# Patient Record
Sex: Female | Born: 1950 | Race: Black or African American | Hispanic: No | Marital: Married | Smoking: Never smoker
Health system: Southern US, Community
[De-identification: ages and names within clinical notes are randomized; demographics above are authoritative.]

## PROBLEM LIST (undated history)

## (undated) DIAGNOSIS — R2240 Localized swelling, mass and lump, unspecified lower limb: Secondary | ICD-10-CM

## (undated) HISTORY — PX: MASS EXCISION: SHX2000

## (undated) HISTORY — PX: EYE SURGERY: SHX253

## (undated) HISTORY — PX: CERVICAL CONIZATION W/BX: SHX1330

## (undated) HISTORY — DX: Localized swelling, mass and lump, unspecified lower limb: R22.40

---

## 2012-05-10 ENCOUNTER — Ambulatory Visit (INDEPENDENT_AMBULATORY_CARE_PROVIDER_SITE_OTHER): Payer: Self-pay | Admitting: General Surgery

## 2012-05-17 ENCOUNTER — Encounter (INDEPENDENT_AMBULATORY_CARE_PROVIDER_SITE_OTHER): Payer: Self-pay | Admitting: Surgery

## 2012-05-17 ENCOUNTER — Ambulatory Visit (INDEPENDENT_AMBULATORY_CARE_PROVIDER_SITE_OTHER): Payer: Self-pay | Admitting: Surgery

## 2012-05-17 VITALS — BP 118/80 | HR 73 | Temp 97.5°F | Resp 18 | Ht 60.0 in | Wt 152.4 lb

## 2012-05-17 DIAGNOSIS — R2240 Localized swelling, mass and lump, unspecified lower limb: Secondary | ICD-10-CM | POA: Insufficient documentation

## 2012-05-17 DIAGNOSIS — R2242 Localized swelling, mass and lump, left lower limb: Secondary | ICD-10-CM

## 2012-05-17 DIAGNOSIS — R229 Localized swelling, mass and lump, unspecified: Secondary | ICD-10-CM

## 2012-05-17 HISTORY — DX: Localized swelling, mass and lump, unspecified lower limb: R22.40

## 2012-05-17 NOTE — Progress Notes (Signed)
Subjective:     Patient ID: Kari Ewing, female   DOB: [birth year], 62 y.o.   MRN: [id number]  HPI  TYSON MASIN  1960-09-02 [id number]  Patient Care Team: Quitman Livings as PCP - General (Internal Medicine) Q[place]man Livings as Attending Physician (Internal Medicine)  This patient is a 62 y.o.female who presents today for surgical evaluation at the request of Dr. Roseanne Reno.   Reason for visit: Persistent wound on left thigh  Pleasant female originally from [place].  Relocated here in 2012.  Comes in today with her daughter who is able to translate and feels comfortable doing that.  Patient had pain itchy fold area on her left thigh for some time.  Began to become ulcerated and three years ago.  Occasionally uncomfortable.  Slowly larger.  Has been seen.  Dressing changes done on it.  No improvement.  No improvement with topical care.  Therefore, patient sent to me for evaluation.  There are no active problems to display for this patient.   History reviewed. No pertinent past medical history.  Past Surgical History  Procedure Laterality Date  . Eye surgery      20 Years Ago.    History   Social History  . Marital Status: Married    Spouse Name: N/A    Number of Children: N/A  . Years of Education: N/A   Occupational History  . Not on file.   Social History Main Topics  . Smoking status: Never Smoker   . Smokeless tobacco: Never Used  . Alcohol Use: No  . Drug Use: No  . Sexually Active: Not on file   Other Topics Concern  . Not on file   Social History Narrative   Originally from [place].  Relocated to [facility] 2012    History reviewed. No pertinent family history.  No current outpatient prescriptions on file.   No current facility-administered medications for this visit.     No Known Allergies  BP 118/80  Pulse 73  Temp(Src) 97.5 F (36.4 C) (Temporal)  Resp 18  Ht 5' (1.524 m)  Wt 152 lb 6.4 oz (69.128 kg)  BMI 29.76 kg/m2  No results  found.   Review of Systems  Constitutional: Negative for fever, chills, diaphoresis, appetite change and fatigue.  HENT: Negative for ear pain, sore throat, trouble swallowing, neck pain and ear discharge.   Eyes: Negative for photophobia, discharge and visual disturbance.  Respiratory: Negative for cough, choking, chest tightness and shortness of breath.   Cardiovascular: Negative for chest pain and palpitations.  Gastrointestinal: Negative for nausea, vomiting, abdominal pain, diarrhea, constipation, anal bleeding and rectal pain.  Endocrine: Negative for cold intolerance and heat intolerance.  Genitourinary: Negative for dysuria, frequency and difficulty urinating.  Musculoskeletal: Negative for myalgias and gait problem.  Skin: Positive for wound. Negative for color change, pallor and rash.  Allergic/Immunologic: Negative for environmental allergies, food allergies and immunocompromised state.  Neurological: Negative for dizziness, speech difficulty, weakness and numbness.  Hematological: Negative for adenopathy.  Psychiatric/Behavioral: Negative for confusion and agitation. The patient is not nervous/anxious.        Objective:   Physical Exam  Constitutional: She is oriented to person, place, and time. She appears well-developed and well-nourished. No distress.  HENT:  Head: Normocephalic.  Mouth/Throat: Oropharynx is clear and moist. No oropharyngeal exudate.  Eyes: Conjunctivae and EOM are normal. Pupils are equal, round, and reactive to light. No scleral icterus.  Neck: Normal range of motion. Neck supple. No tracheal deviation  present.  Cardiovascular: Normal rate, regular rhythm and intact distal pulses.   Pulmonary/Chest: Effort normal and breath sounds normal. No respiratory distress. She exhibits no tenderness.  Abdominal: Soft. She exhibits no distension and no mass. There is no tenderness. Hernia confirmed negative in the right inguinal area and confirmed negative in the  left inguinal area.  Genitourinary: No vaginal discharge found.  Musculoskeletal: Normal range of motion. She exhibits no tenderness.  Lymphadenopathy:    She has no cervical adenopathy.       Right: No inguinal adenopathy present.       Left: No inguinal adenopathy present.  Neurological: She is alert and oriented to person, place, and time. No cranial nerve deficit. She exhibits normal muscle tone. Coordination normal.  Skin: Skin is warm and dry. No rash noted. She is not diaphoretic. No erythema. No pallor.     Psychiatric: She has a normal mood and affect. Her behavior is normal. Judgment and thought content normal.       Assessment:     Ulcerated mass on left anterior thigh x3 years.  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  Too large and deep to try and do under local anesthesia.  Would like at least monitor deep sedation with possible/probable laryngeal mask airway.  The patient is interested in proceeding:  The pathophysiology of skin & subcutaneous masses was discussed.  Natural history risks without surgery were discussed.  I recommended surgery to remove the mass.  I explained the technique of removal with use of local anesthesia & possible need for more aggressive sedation/anesthesia for patient comfort.    Risks such as bleeding, infection, heart attack, death, and other risks were discussed.  I noted a good likelihood this will help address the problem.   Possibility that this will not correct all symptoms was explained. Possibility of regrowth/recurrence of the mass was discussed.  We will work to minimize complications. Questions were answered.  The patient & her daughter express understanding & wish to proceed with surgery.

## 2012-05-17 NOTE — Patient Instructions (Addendum)
See the Handout(s) we gave you.  Consider surgery to remove the persistent ulcerated mass on your left anterior thigh.  Please call our office at [phone] if you wish to schedule surgery or if you have further questions / concerns.   GENERAL SURGERY: POST OP INSTRUCTIONS  1. DIET: Follow a light bland diet the first 24 hours after arrival home, such as soup, liquids, crackers, etc.  Be sure to include lots of fluids daily.  Avoid fast food or heavy meals as your are more likely to get nauseated.   2. Take your usually prescribed home medications unless otherwise directed. 3. PAIN CONTROL: a. Pain is best controlled by a usual combination of three different methods TOGETHER: i. Ice/Heat ii. Over the counter pain medication iii. Prescription pain medication b. Most patients will experience some swelling and bruising around the incisions.  Ice packs or heating pads (30-60 minutes up to 6 times a day) will help. Use ice for the first few days to help decrease swelling and bruising, then switch to heat to help relax tight/sore spots and speed recovery.  Some people prefer to use ice alone, heat alone, alternating between ice & heat.  Experiment to what works for you.  Swelling and bruising can take several weeks to resolve.   c. It is helpful to take an over-the-counter pain medication regularly for the first few weeks.  Choose one of the following that works best for you: i. Naproxen (Aleve, etc)  Two 220mg  tabs twice a day ii. Ibuprofen (Advil, etc) Three 200mg  tabs four times a day (every meal & bedtime) iii. Acetaminophen (Tylenol, etc) 500-650mg  four times a day (every meal & bedtime) d. A  prescription for pain medication (such as oxycodone, hydrocodone, etc) should be given to you upon discharge.  Take your pain medication as prescribed.  i. If you are having problems/concerns with the prescription medicine (does not control pain, nausea, vomiting, rash, itching, etc), please call us [phone]  [phone] to see if we need to switch you to a different pain medicine that will work better for you and/or control your side effect better. ii. If you need a refill on your pain medication, please contact your pharmacy.  They will contact our office to request authorization. Prescriptions will not be filled after 5 pm or on week-ends. 4. Avoid getting constipated.  Between the surgery and the pain medications, it is common to experience some constipation.  Increasing fluid intake and taking a fiber supplement (such as Metamucil, Citrucel, FiberCon, MiraLax, etc) 1-2 times a day regularly will usually help prevent this problem from occurring.  A mild laxative (prune juice, Milk of Magnesia, MiraLax, etc) should be taken according to package directions if there are no bowel movements after 48 hours.   5. Wash / shower every day.  You may shower over the dressings as they are waterproof.  Continue to shower over incision(s) after the dressing is off. 6. Remove your waterproof bandages 5 days after surgery.  You may leave the incision open to air.  You may have skin tapes (Steri Strips) covering the incision(s).  Leave them on until one week, then remove.  You may replace a dressing/Band-Aid to cover the incision for comfort if you wish.      7. ACTIVITIES as tolerated:   a. You may resume regular (light) daily activities beginning the next day-such as daily self-care, walking, climbing stairs-gradually increasing activities as tolerated.  If you can walk 30 minutes without difficulty, it  is safe to try more intense activity such as jogging, treadmill, bicycling, low-impact aerobics, swimming, etc. b. Save the most intensive and strenuous activity for last such as sit-ups, heavy lifting, contact sports, etc  Refrain from any heavy lifting or straining until you are off narcotics for pain control.   c. DO NOT PUSH THROUGH PAIN.  Let pain be your guide: If it hurts to do something, don't do it.  Pain is your  body warning you to avoid that activity for another week until the pain goes down. d. You may drive when you are no longer taking prescription pain medication, you can comfortably wear a seatbelt, and you can safely maneuver your car and apply brakes. e. Bonita Quin may have sexual intercourse when it is comfortable.  8. FOLLOW UP in our office a. Please call [facility] at [phone] to set up an appointment to see your surgeon in the office for a follow-up appointment approximately 2-3 weeks after your surgery. b. Make sure that you call for this appointment the day you arrive home to insure a convenient appointment time. 9. IF YOU HAVE DISABILITY OR FAMILY LEAVE FORMS, BRING THEM TO THE OFFICE FOR PROCESSING.  DO NOT GIVE THEM TO YOUR DOCTOR.   WHEN TO CALL us [phone]: 1. Poor pain control 2. Reactions / problems with new medications (rash/itching, nausea, etc)  3. Fever over 101.5 F (38.5 C) 4. Worsening swelling or bruising 5. Continued bleeding from incision. 6. Increased pain, redness, or drainage from the incision 7. Difficulty breathing / swallowing   The clinic staff is available to answer your questions during regular business hours (8:30am-5pm).  Please don't hesitate to call and ask to speak to one of our nurses for clinical concerns.   If you have a medical emergency, go to the nearest emergency room or call 911.  A surgeon from [facility] is always on call at the [facility], [place] [address], [place], [place]  [address] ? MAIN: [phone] ? TOLL FREE: [phone] ?  FAX [phone] [email or web address]

## 2012-06-06 ENCOUNTER — Other Ambulatory Visit (INDEPENDENT_AMBULATORY_CARE_PROVIDER_SITE_OTHER): Payer: Self-pay | Admitting: Surgery

## 2012-06-06 DIAGNOSIS — D367 Benign neoplasm of other specified sites: Secondary | ICD-10-CM

## 2012-06-10 ENCOUNTER — Telehealth (INDEPENDENT_AMBULATORY_CARE_PROVIDER_SITE_OTHER): Payer: Self-pay | Admitting: General Surgery

## 2012-06-10 NOTE — Telephone Encounter (Signed)
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  Excision = cure.  Tell pt the good news! ------

## 2012-06-10 NOTE — Telephone Encounter (Signed)
Patient made aware of path results. Will follow up at appt and call with any questions prior.  

## 2012-06-14 ENCOUNTER — Ambulatory Visit: Payer: No Typology Code available for payment source | Attending: Family Medicine | Admitting: Family Medicine

## 2012-06-14 VITALS — BP 116/74 | HR 70 | Temp 97.9°F | Resp 16 | Wt 154.8 lb

## 2012-06-14 DIAGNOSIS — M7989 Other specified soft tissue disorders: Secondary | ICD-10-CM | POA: Insufficient documentation

## 2012-06-14 DIAGNOSIS — B353 Tinea pedis: Secondary | ICD-10-CM

## 2012-06-14 MED ORDER — CLOTRIMAZOLE 1 % EX CREA: TOPICAL_CREAM | CUTANEOUS | Status: AC

## 2012-06-14 NOTE — Patient Instructions (Signed)
Athlete's Foot Athlete's foot (tinea pedis) is a fungal infection of the skin on the feet. It often occurs on the skin between the toes or underneath the toes. It can also occur on the soles of the feet. Athlete's foot is more likely to occur in hot, humid weather. Not washing your feet or changing your socks often enough can contribute to athlete's foot. The infection can spread from person to person (contagious). CAUSES Athlete's foot is caused by a fungus. This fungus thrives in warm, moist places. Most people get athlete's foot by sharing shower stalls, towels, and wet floors with an infected person. People with weakened immune systems, including those with diabetes, may be more likely to get athlete's foot. SYMPTOMS   Itchy areas between the toes or on the soles of the feet.  White, flaky, or scaly areas between the toes or on the soles of the feet.  Tiny, intensely itchy blisters between the toes or on the soles of the feet.  Tiny cuts on the skin. These cuts can develop a bacterial infection.  Thick or discolored toenails. DIAGNOSIS  Your caregiver can usually tell what the problem is by doing a physical exam. Your caregiver may also take a skin sample from the rash area. The skin sample may be examined under a microscope, or it may be tested to see if fungus will grow in the sample. A sample may also be taken from your toenail for testing. TREATMENT  Over-the-counter and prescription medicines can be used to kill the fungus. These medicines are available as powders or creams. Your caregiver can suggest medicines for you. Fungal infections respond slowly to treatment. You may need to continue using your medicine for several weeks. PREVENTION   Do not share towels.  Wear sandals in wet areas, such as shared locker rooms and shared showers.  Keep your feet dry. Wear shoes that allow air to circulate. Wear cotton or wool socks. HOME CARE INSTRUCTIONS   Take medicines as directed by  your caregiver. Do not use steroid creams on athlete's foot.  Keep your feet clean and cool. Wash your feet daily and dry them thoroughly, especially between your toes.  Change your socks every day. Wear cotton or wool socks. In hot climates, you may need to change your socks 2 to 3 times per day.  Wear sandals or canvas tennis shoes with good air circulation.  If you have blisters, soak your feet in Burow's solution or Epsom salts for 20 to 30 minutes, 2 times a day to dry out the blisters. Make sure you dry your feet thoroughly afterward. SEEK MEDICAL CARE IF:   You have a fever.  You have swelling, soreness, warmth, or redness in your foot.  You are not getting better after 7 days of treatment.  You are not completely cured after 30 days.  You have any problems caused by your medicines. MAKE SURE YOU:   Understand these instructions.  Will watch your condition.  Will get help right away if you are not doing well or get worse. Document Released: 12/20/1999 Document Revised: 03/16/2011 Document Reviewed: 10/10/2010 ExitCare Patient Information 2014 [employer].  

## 2012-06-14 NOTE — Progress Notes (Signed)
Subjective:     Patient ID: Kari Ewing, female   DOB: [birth year], 62 y.o.   MRN: [id number]  HPIPt here to establish care - walk in.  She has had itchy feet for 1 year. She has tried lotion but it has not helped. Occasional flaking/scaling. Denies burning. Occasional swelling if she has been standing/sitting all day long.    Review of Systems per hpi     Objective:   Physical Exam b/l feet neurovasc intact, slight erythema and dryness.      Assessment:     Tinea pedis - Plan: clotrimazole (LOTRIMIN) 1 % cream       Plan:     Suspect athletes foot - will treat with clotimazole and see how she does. If not improving at all next week would do KOH scraping, consider derm referral, and check neuropathy labs.   Will have her rtc for f/u next week to readdress this and to address her other concerns of otalgia and watery eye. Call or rtc prn before that time if needed.

## 2012-06-14 NOTE — Progress Notes (Signed)
Patient presents today with bilateral foot pain for about a year Also complains of pain in both ears

## 2012-06-20 ENCOUNTER — Ambulatory Visit (INDEPENDENT_AMBULATORY_CARE_PROVIDER_SITE_OTHER): Payer: PRIVATE HEALTH INSURANCE | Admitting: Surgery

## 2012-06-20 ENCOUNTER — Encounter (INDEPENDENT_AMBULATORY_CARE_PROVIDER_SITE_OTHER): Payer: Self-pay | Admitting: Surgery

## 2012-06-20 VITALS — BP 132/78 | HR 78 | Temp 97.8°F | Resp 14 | Ht 58.5 in | Wt 156.4 lb

## 2012-06-20 DIAGNOSIS — R229 Localized swelling, mass and lump, unspecified: Secondary | ICD-10-CM

## 2012-06-20 DIAGNOSIS — R2242 Localized swelling, mass and lump, left lower limb: Secondary | ICD-10-CM

## 2012-06-20 NOTE — Progress Notes (Signed)
Subjective:     Patient ID: Kari Ewing, female   DOB: 1950-12-22, 62 y.o.   MRN: [id number]  HPI  Kari Ewing  [birth year] [id number]  Patient Care Team: Kari Fleet, MD as PCP - General (Family Medicine)  This patient is a 62 y.o.female who presents today for surgical evaluation s/p removal of thigh mass 06/06/2012  The patient comes in with her daughter.  Pain down.  A little bit of drainage at the incision concerns her.  No fevers or chills.  Walking well.  They receive results from pathology & received it was benign.  FINAL DIAGNOSIS Diagnosis Soft tissue mass, simple excision, left anterior thigh - NODULAR HYDRADENOMA. - SEE COMMENT. Microscopic Comment Drs. Phadke and Yaar of dermatopathology have reviewed the case and concur with the interpretation. (JBK:caf 06/10/12) [name] Pathologist, Electronic Signature (Case signed 06/10/2012) Specimen Kari Ewing and Clinical Information Specimen Comment Subcutaneous mass, 3 yr ulcer Specimen(s) Obtained: Soft tissue mass, simple excision, left anterior thigh Specimen Clinical Information R/O Marjolin ulcer Kari Ewing Received in formalin is a 3.8 x 3.4 x 3.3 cm indurated fatty tissue with an overlying 4 x 2.2 cm tan-brown skin ellipse. The central skin has a 2.1 x 2 x 0.3 cm roughened nodular area. The specimen is inked, and on sectioning has an underlying 3.4 x 2.4 x 2 cm well-defined nodular mass with tan-white firm to focally friable cut surfaces, and a focal 1.4 cm in diameter cystic area containing clear fluid. Representative sections are submitted as follows: A = section of cystic component. B, C = superficial halves including overlying nodular skin. D, E = deeper half sections. F = 2 radial sections of ends. Total of 6 blocks. (SSW:ecj 06/07/2012) 1 of 2 FINAL for Kari Ewing, [name] Report signed out from the following location(s) [facility] [address],[place]  [address].CLIA:[id number]., [facility] [address] [place], [place], [place] [address]. CLIA #: [id number], 2 of    Patient Active Problem List   Diagnosis Date Noted  . 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 Eye surgery      20 Years Ago.  . Mass excision Left [id number]    History   Social History  . Marital Status: Married    Spouse Name: N/A    Number of Children: N/A  . Years of Education: N/A   Occupational History  . Not on file.   Social History Main Topics  . Smoking status: Never Smoker   . Smokeless tobacco: Never Used  . Alcohol Use: No  . Drug Use: No  . Sexually Active: Not on file   Other Topics Concern  . Not on file   Social History Narrative   Originally from [place].  Relocated to [facility] 2012    History reviewed. No pertinent family history.  Current Outpatient Prescriptions  Medication Sig Dispense Refill  . clotrimazole (LOTRIMIN) 1 % cream Apply twice daily for 4 weeks  60 g  0   No current facility-administered medications for this visit.     No Known Allergies  BP 132/78  Pulse 78  Temp(Src) 97.8 F (36.6 C) (Temporal)  Resp 14  Ht 4' 10.5" (1.486 m)  Wt 156 lb 6.4 oz (70.943 kg)  BMI 32.13 kg/m2  No results  found.   Review of Systems  Constitutional: Negative for fever, chills and diaphoresis.  HENT: Negative for ear pain, sore throat and trouble swallowing.   Eyes: Negative for photophobia and visual disturbance.  Respiratory: Negative for cough and choking.   Cardiovascular: Negative for chest pain and palpitations.  Gastrointestinal: Negative for nausea, vomiting, abdominal pain, diarrhea, constipation, anal bleeding and rectal pain.  Genitourinary: Negative for dysuria, frequency and  difficulty urinating.  Musculoskeletal: Negative for myalgias and gait problem.  Skin: Positive for wound. Negative for color change, pallor and rash.  Neurological: Negative for dizziness, speech difficulty, weakness and numbness.  Hematological: Negative for adenopathy.  Psychiatric/Behavioral: Negative for confusion and agitation. The patient is not nervous/anxious.        Objective:   Physical Exam  Constitutional: She is oriented to person, place, and time. She appears well-developed and well-nourished. No distress.  HENT:  Head: Normocephalic.  Mouth/Throat: Oropharynx is clear and moist. No oropharyngeal exudate.  Eyes: Conjunctivae and EOM are normal. Pupils are equal, round, and reactive to light. No scleral icterus.  Neck: Normal range of motion. No tracheal deviation present.  Cardiovascular: Normal rate and intact distal pulses.   Pulmonary/Chest: Effort normal. No respiratory distress. She exhibits no tenderness.  Abdominal: Soft. She exhibits no distension. There is no tenderness. Hernia confirmed negative in the right inguinal area and confirmed negative in the left inguinal area.  Incisions clean with normal healing ridges.  No hernias  Genitourinary: No vaginal discharge found.  Musculoskeletal: Normal range of motion. She exhibits no tenderness.       Legs: Lymphadenopathy:       Right: No inguinal adenopathy present.       Left: No inguinal adenopathy present.  Neurological: She is alert and oriented to person, place, and time. No cranial nerve deficit. She exhibits normal muscle tone. Coordination normal.  Skin: Skin is warm and dry. No rash noted. She is not diaphoretic.  Psychiatric: Her behavior is normal. Her mood appears anxious.  Consolable       Assessment:     Two weeks status for removal of benign cystic mass on left anterior thigh (nodular hidradenoma) small separation of skin but no evidence of abscess     Plan:     They are relieved it was  benign.  I have recommended covering the small area of skin separation with a Band-Aid.  Wash the wound with soap and water every day.  It should scab over and close.  If worsens, come back sooner.  Otherwise see [place] every two weeks until this closes down.  Increase activity as tolerated to regular activity.  Low impact exercise such as walking an hour a day at least ideal.  Do not push through pain.  Diet as tolerated.  Low fat high fiber diet ideal.  Bowel regimen with 30 g fiber a day and fiber supplement as needed to avoid problems.   Instructions discussed.  Followup with primary care physician for other health issues as would normally be done.  Questions answered.  The patient expressed understanding and appreciation

## 2012-06-20 NOTE — Patient Instructions (Addendum)
WOUND CARE  It is important that the wound be kept open.   -Keeping the skin edges apart will allow the wound to gradually heal from the base upwards.   - If the skin edges of the wound close too early, a new fluid pocket can form and infection can occur. -This is the reason to pack deeper wounds with gauze or ribbon -This is why drained wounds cannot be sewed closed right away  A healthy wound should form a lining of bright red "beefy" granulating tissue that will help shrink the wound and help the edges grow new skin into it.   -A little mucus / yellow discharge is normal (the body's natural way to try and form a scab) and should be gently washed off with soap and water with daily dressing changes.  -Green or foul smelling drainage implies bacterial colonization and can slow wound healing - a short course of antibiotic ointment (3-5 days) can help it clear up.  Call the doctor if it does not improve or worsens  -Avoid use of antibiotic ointments for more than a week as they can slow wound healing over time.    -Sometimes other wound care products will be used to reduce need for dressing changes and/or help clean up dirty wounds -Sometimes the surgeon needs to debride the wound in the office to remove dead or infected tissue out of the wound so it can heal more quickly and safely.    Change the dressing at least once a day -Wash the wound with mild soap and water gently every day.  It is good to shower or bathe the wound to help it clean out. -Use clean 4x4 gauze or large Band Aid (it does not need to be sterile, just clean) -Keep the skin dry around the wound to prevent breakdown and irritation.  Complete all antibiotics through the entire prescription to help the infection heal and prevent new places of infection   Returning the see the surgeon is helpful to follow the healing process and help the wound close as fast as possible.

## 2012-06-22 ENCOUNTER — Ambulatory Visit: Payer: No Typology Code available for payment source | Attending: Family Medicine | Admitting: Internal Medicine

## 2012-06-22 VITALS — BP 114/77 | HR 76 | Temp 98.0°F | Resp 17 | Ht 60.0 in | Wt 154.2 lb

## 2012-06-22 DIAGNOSIS — H9201 Otalgia, right ear: Secondary | ICD-10-CM

## 2012-06-22 DIAGNOSIS — H9209 Otalgia, unspecified ear: Secondary | ICD-10-CM

## 2012-06-22 NOTE — Progress Notes (Signed)
Patient here for right ear discomfort Says suffers from wax build up

## 2012-06-22 NOTE — Progress Notes (Signed)
Patient ID: Kari Ewing, female   DOB: 1950/01/29, 62 y.o.   MRN: [id number]  CC: Right ear pain  HPI: 62 year old female with no significant past medical history who presents to our clinic for followup and complains of hearing loss in right ear as well as pain in the right ear. Patient's daughter reported she initially thought that this was cerumen but in the right ear however even with some wax removal the patient still continues to have pain as well as hearing loss.  No Known Allergies Past Medical History  Diagnosis Date  . 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 clotrimazole (LOTRIMIN) 1 % cream Apply twice daily for 4 weeks  60 g  0   No current facility-administered medications on file prior to visit.   Family medical history significant for HTN, HLD  History   Social History  . Marital Status: Married    Spouse Name: N/A    Number of Children: N/A  . Years of Education: N/A   Occupational History  . Not on file.   Social History Main Topics  . Smoking status: Never Smoker   . Smokeless tobacco: Never Used  . Alcohol Use: No  . Drug Use: No  . Sexually Active: Not on file   Other Topics Concern  . Not on file   Social History Narrative   Originally from [place].  Relocated to [facility] 2012    Review of Systems  Constitutional: Negative for fever, chills, diaphoresis, activity change, appetite change and fatigue.  HENT: Negative for ear pain, nosebleeds, congestion, facial swelling, rhinorrhea, neck pain, neck stiffness and ear discharge.   Eyes: Negative for pain, discharge, redness, itching and visual disturbance.  Respiratory: Negative for cough, choking, chest tightness, shortness of breath, wheezing and stridor.   Cardiovascular: Negative for chest pain, palpitations and leg swelling.  Gastrointestinal: Negative for  abdominal distention.  Genitourinary: Negative for dysuria, urgency, frequency, hematuria, flank pain, decreased urine volume, difficulty urinating and dyspareunia.  Musculoskeletal: Negative for back pain, joint swelling, arthralgias and gait problem.  Neurological: Negative for dizziness, tremors, seizures, syncope, facial asymmetry, speech difficulty, weakness, light-headedness, numbness and headaches.  Hematological: Negative for adenopathy. Does not bruise/bleed easily.  Psychiatric/Behavioral: Negative for hallucinations, behavioral problems, confusion, dysphoric mood, decreased concentration and agitation.    Objective:   Filed Vitals:   06/22/12 1241  BP: 114/77  Pulse: 76  Temp: 98 F (36.7 C)  Resp: 17    Physical Exam  Constitutional: Appears well-developed and well-nourished. No distress.  HENT: Normocephalic. Tympanic membrane visualized in left year. Right ear unable to visualize tympanic membrane, whitish material noticed in the area of tympanic membrane Eyes: Conjunctivae and EOM are normal. PERRLA, no scleral icterus.  Neck: Normal ROM. Neck supple. No JVD. No tracheal deviation. No thyromegaly.  CVS: RRR, S1/S2 +, no murmurs, no gallops, no carotid bruit.  Pulmonary: Effort and breath sounds normal, no stridor, rhonchi, wheezes, rales.  Abdominal: Soft. BS +,  no distension, tenderness, rebound or guarding.  Musculoskeletal: Normal range of motion. No edema and no tenderness.  Lymphadenopathy: No lymphadenopathy noted, cervical, inguinal. Neuro: Alert. Normal reflexes, muscle tone coordination. No cranial nerve deficit. Skin: Skin is warm and dry. No rash noted. Not diaphoretic. No erythema. No pallor.  Psychiatric: Normal mood and affect. Behavior, judgment, thought content normal.   No results found for this basename: WBC, HGB,  HCT, MCV, PLT   No results found for this basename: CREATININE, BUN, NA, K, CL, CO2    No results found for this basename: HGBA1C    Lipid Panel  No results found for this basename: chol, trig, hdl, cholhdl, vldl, ldlcalc       Assessment and plan:   Patient Active Problem List   Diagnosis Date Noted  . Ear pain, right  06/22/2012    Priority: High - In addition to slight hearing loss on the right ear  - Will provide referral to ENT. On physical exam there are is no obvious impacted cerumen to explain the etiology of pain and hearing loss.

## 2012-06-22 NOTE — Patient Instructions (Signed)

## 2012-07-06 ENCOUNTER — Encounter (INDEPENDENT_AMBULATORY_CARE_PROVIDER_SITE_OTHER): Payer: Self-pay | Admitting: Surgery

## 2012-07-06 ENCOUNTER — Ambulatory Visit (INDEPENDENT_AMBULATORY_CARE_PROVIDER_SITE_OTHER): Payer: PRIVATE HEALTH INSURANCE | Admitting: Surgery

## 2012-07-06 VITALS — BP 124/78 | HR 74 | Temp 97.2°F | Resp 14 | Ht 58.5 in | Wt 155.6 lb

## 2012-07-06 DIAGNOSIS — R229 Localized swelling, mass and lump, unspecified: Secondary | ICD-10-CM

## 2012-07-06 DIAGNOSIS — R2242 Localized swelling, mass and lump, left lower limb: Secondary | ICD-10-CM

## 2012-07-06 NOTE — Progress Notes (Signed)
Subjective:     Patient ID: Kari Ewing, female   DOB: [birth year], 62 y.o.   MRN: [id number]  HPI   [name]  22-Mar-1950 [id number]  Patient Care Team: Cleora Fleet, MD as PCP - General (Family Medicine)  This patient is a 62 y.o.female who presents today for surgical evaluation s/p removal of thigh mass 06/06/2012  The patient comes in with her daughter.  Pain minoe except when walking.  Drainage is gone.  Just covering up with a Band-Aid that needs to be changed once a day.  She tolerates walking but is afraid she may hurt things if she does not too much.  No fevers or chills.  Walking well.  They receive results from pathology & received it was benign.  FINAL DIAGNOSIS Diagnosis Soft tissue mass, simple excision, left anterior thigh - NODULAR HYDRADENOMA. - SEE COMMENT. Microscopic Comment Drs. Phadke and Yaar of dermatopathology have reviewed the case and concur with the interpretation. (JBK:caf 06/10/12) [name] Pathologist, Electronic Signature (Case signed 06/10/2012) Specimen [name] and Clinical Information Specimen Comment Subcutaneous mass, 3 yr ulcer Specimen(s) Obtained: Soft tissue mass, simple excision, left anterior thigh Specimen Clinical Information R/O Marjolin ulcer [name] Received in formalin is a 3.8 x 3.4 x 3.3 cm indurated fatty tissue with an overlying 4 x 2.2 cm tan-brown skin ellipse. The central skin has a 2.1 x 2 x 0.3 cm roughened nodular area. The specimen is inked, and on sectioning has an underlying 3.4 x 2.4 x 2 cm well-defined nodular mass with tan-white firm to focally friable cut surfaces, and a focal 1.4 cm in diameter cystic area containing clear fluid. Representative sections are submitted as follows: A = section of cystic component. B, C = superficial halves including overlying nodular skin. D, E = deeper half sections. F = 2 radial sections of ends. Total of 6 blocks. (SSW:ecj 06/07/2012) 1 of 2 FINAL for Brandis,  Arminda A [phone]) Report signed out from the following location(s) [facility] [address],[place] [address].CLIA:[id number]., [facility] [address] [place], [place], [place] [address]. CLIA #: [id number], 2 of    Patient Active Problem List   Diagnosis Date Noted  . Ear pain 06/22/2012  . 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 Eye surgery      20 Years Ago.  . Mass excision Left [id number]    History   Social History  . Marital Status: Married    Spouse Name: N/A    Number of Children: N/A  . Years of Education: N/A   Occupational History  . Not on file.   Social History Main Topics  . Smoking status: Never Smoker   . Smokeless tobacco: Never Used  . Alcohol Use: No  . Drug Use: No  . Sexually Active: Not on file   Other Topics Concern  . Not on file   Social History Narrative   Originally from [place].  Relocated to [facility] 2012    No family history on file.  Current Outpatient Prescriptions  Medication Sig Dispense Refill  . acetaminophen (TYLENOL) 325 MG tablet Take 650 mg by mouth every 6 (six) hours as needed for pain.      . clotrimazole (LOTRIMIN) 1 % cream Apply twice daily for 4 weeks  60 g  0   No current facility-administered medications for this visit.     No Known Allergies  BP 124/78  Pulse 74  Temp(Src) 97.2 F (36.2 C)  Resp 14  Ht 4' 10.5" (1.486 m)  Wt 155 lb 9.6 oz (70.58 kg)  BMI 31.96 kg/m2  No results found.   Review of Systems  Constitutional: Negative for fever, chills and diaphoresis.  HENT: Negative for ear pain, sore throat and trouble swallowing.   Eyes: Negative for photophobia and visual disturbance.  Respiratory: Negative for cough and  choking.   Cardiovascular: Negative for chest pain and palpitations.  Gastrointestinal: Negative for nausea, vomiting, abdominal pain, diarrhea, constipation, anal bleeding and rectal pain.  Genitourinary: Negative for dysuria, frequency and difficulty urinating.  Musculoskeletal: Negative for myalgias and gait problem.  Skin: Positive for wound. Negative for color change, pallor and rash.  Neurological: Negative for dizziness, speech difficulty, weakness and numbness.  Hematological: Negative for adenopathy.  Psychiatric/Behavioral: Negative for confusion and agitation. The patient is not nervous/anxious.        Objective:   Physical Exam  Constitutional: She is oriented to person, place, and time. She appears well-developed and well-nourished. No distress.  HENT:  Head: Normocephalic.  Mouth/Throat: Oropharynx is clear and moist. No oropharyngeal exudate.  Eyes: Conjunctivae and EOM are normal. Pupils are equal, round, and reactive to light. No scleral icterus.  Neck: Normal range of motion. No tracheal deviation present.  Cardiovascular: Normal rate and intact distal pulses.   Pulmonary/Chest: Effort normal. No respiratory distress. She exhibits no tenderness.  Abdominal: Soft. She exhibits no distension. There is no tenderness. Hernia confirmed negative in the right inguinal area and confirmed negative in the left inguinal area.  Incisions clean with normal healing ridges.  No hernias  Genitourinary: No vaginal discharge found.  Musculoskeletal: Normal range of motion. She exhibits no tenderness.       Legs: Lymphadenopathy:       Right: No inguinal adenopathy present.       Left: No inguinal adenopathy present.  Neurological: She is alert and oriented to person, place, and time. No cranial nerve deficit. She exhibits normal muscle tone. Coordination normal.  Skin: Skin is warm and dry. No rash noted. She is not diaphoretic.  Psychiatric: Her behavior is normal. Her mood appears  anxious.  Consolable       Assessment:     One month status for removal of benign cystic mass on left anterior thigh (nodular hidradenoma) small separation of skin but no evidence of abscess     Plan:     They are relieved it was benign.  I have recommended covering the small area of skin separation with a Band-Aid.  Wash the wound with soap and water every day.  It should scab over and close.  If worsens, come back sooner.  Otherwise see [place] every 6 weeks until this closes down.  Vicryl stitches should fall out on own - avoid removing unless not healing  Increase activity as tolerated to regular activity.  Low impact exercise such as walking an hour a day at least ideal.  Do not push through pain.    Instructions discussed.  Followup with primary care physician for other health issues as would normally be done.  Questions answered.  The patient expressed understanding and appreciation

## 2012-07-06 NOTE — Patient Instructions (Addendum)
Continue wound care with soap and water over wound.  You can leave open to air to let it dry out or continue to cover with a Band-Aid.

## 2012-08-17 ENCOUNTER — Ambulatory Visit (INDEPENDENT_AMBULATORY_CARE_PROVIDER_SITE_OTHER): Payer: PRIVATE HEALTH INSURANCE | Admitting: Surgery

## 2012-08-17 ENCOUNTER — Encounter (INDEPENDENT_AMBULATORY_CARE_PROVIDER_SITE_OTHER): Payer: Self-pay | Admitting: Surgery

## 2012-08-17 VITALS — BP 102/66 | HR 72 | Resp 16 | Ht 59.0 in | Wt 154.0 lb

## 2012-08-17 DIAGNOSIS — R2242 Localized swelling, mass and lump, left lower limb: Secondary | ICD-10-CM

## 2012-08-17 DIAGNOSIS — R229 Localized swelling, mass and lump, unspecified: Secondary | ICD-10-CM

## 2012-08-17 NOTE — Patient Instructions (Addendum)
GENERAL SURGERY: POST OP INSTRUCTIONS  1. DIET: Follow a light bland diet the first 24 hours after arrival home, such as soup, liquids, crackers, etc.  Be sure to include lots of fluids daily.  Avoid fast food or heavy meals as your are more likely to get nauseated.   2. Take your usually prescribed home medications unless otherwise directed. 3. PAIN CONTROL: a. Pain is best controlled by a usual combination of three different methods TOGETHER: i. Ice/Heat ii. Over the counter pain medication iii. Prescription pain medication b. Most patients will experience some swelling and bruising around the incisions.  Ice packs or heating pads (30-60 minutes up to 6 times a day) will help. Use ice for the first few days to help decrease swelling and bruising, then switch to heat to help relax tight/sore spots and speed recovery.  Some people prefer to use ice alone, heat alone, alternating between ice & heat.  Experiment to what works for you.  Swelling and bruising can take several weeks to resolve.   c. It is helpful to take an over-the-counter pain medication regularly for the first few weeks.  Choose one of the following that works best for you: i. Naproxen (Aleve, etc)  Two 274m tabs twice a day ii. Ibuprofen (Advil, etc) Three 2085mtabs four times a day (every meal & bedtime) iii. Acetaminophen (Tylenol, etc) 500-65021mour times a day (every meal & bedtime) d. A  prescription for pain medication (such as oxycodone, hydrocodone, etc) should be given to you upon discharge.  Take your pain medication as prescribed.  i. If you are having problems/concerns with the prescription medicine (does not control pain, nausea, vomiting, rash, itching, etc), please call us [phone] see if we need to switch you to a different pain medicine that will work better for you and/or control your side effect better. ii. If you need a refill on your pain medication, please contact your pharmacy.  They will contact our  office to request authorization. Prescriptions will not be filled after 5 pm or on week-ends. 4. Avoid getting constipated.  Between the surgery and the pain medications, it is common to experience some constipation.  Increasing fluid intake and taking a fiber supplement (such as Metamucil, Citrucel, FiberCon, MiraLax, etc) 1-2 times a day regularly will usually help prevent this problem from occurring.  A mild laxative (prune juice, Milk of Magnesia, MiraLax, etc) should be taken according to package directions if there are no bowel movements after 48 hours.   5. Wash / shower every day.  You may shower over the dressings as they are waterproof.  Continue to shower over incision(s) after the dressing is off. 6. Remove your waterproof bandages 5 days after surgery.  You may leave the incision open to air.  You may have skin tapes (Steri Strips) covering the incision(s).  Leave them on until one week, then remove.  You may replace a dressing/Band-Aid to cover the incision for comfort if you wish.      7. ACTIVITIES as tolerated:   a. You may resume regular (light) daily activities beginning the next day-such as daily self-care, walking, climbing stairs-gradually increasing activities as tolerated.  If you can walk 30 minutes without difficulty, it is safe to try more intense activity such as jogging, treadmill, bicycling, low-impact aerobics, swimming, etc. b. Save the most intensive and strenuous activity for last such as sit-ups, heavy lifting, contact sports, etc  Refrain from any heavy lifting or straining until you  are off narcotics for pain control.   c. DO NOT PUSH THROUGH PAIN.  Let pain be your guide: If it hurts to do something, don't do it.  Pain is your body warning you to avoid that activity for another week until the pain goes down. d. You may drive when you are no longer taking prescription pain medication, you can comfortably wear a seatbelt, and you can safely maneuver your car and apply  brakes. e. Bonita Quin may have sexual intercourse when it is comfortable.  8. FOLLOW UP in our office a. Please call [facility] at [phone] to set up an appointment to see your surgeon in the office for a follow-up appointment approximately 2-3 weeks after your surgery. b. Make sure that you call for this appointment the day you arrive home to insure a convenient appointment time. 9. IF YOU HAVE DISABILITY OR FAMILY LEAVE FORMS, BRING THEM TO THE OFFICE FOR PROCESSING.  DO NOT GIVE THEM TO YOUR DOCTOR.   WHEN TO CALL us [phone]: 1. Poor pain control 2. Reactions / problems with new medications (rash/itching, nausea, etc)  3. Fever over 101.5 F (38.5 C) 4. Worsening swelling or bruising 5. Continued bleeding from incision. 6. Increased pain, redness, or drainage from the incision 7. Difficulty breathing / swallowing   The clinic staff is available to answer your questions during regular business hours (8:30am-5pm).  Please don't hesitate to call and ask to speak to one of our nurses for clinical concerns.   If you have a medical emergency, go to the nearest emergency room or call 911.  A surgeon from [facility] is always on call at the [facility], [place] [address], [place], [place]  [address] ? MAIN: [phone] ? TOLL FREE: [phone] ?  FAX [phone] [email or web address]  Exercise to Lose Weight Exercise and a healthy diet may help you lose weight. Your doctor may suggest specific exercises. EXERCISE IDEAS AND TIPS  Choose low-cost things you enjoy doing, such as walking, bicycling, or exercising to workout videos.  Take stairs instead of the elevator.  Walk during your lunch break.  Park your car further away from work or school.  Go to a gym or an exercise class.  Start with 5 to 10 minutes of exercise each day. Build up to 30 minutes of exercise 4 to 6 days a week.  Wear shoes with good  support and comfortable clothes.  Stretch before and after working out.  Work out until you breathe harder and your heart beats faster.  Drink extra water when you exercise.  Do not do so much that you hurt yourself, feel dizzy, or get very short of breath. Exercises that burn about 150 calories:  Running 1  miles in 15 minutes.  Playing volleyball for 45 to 60 minutes.  Washing and waxing a car for 45 to 60 minutes.  Playing touch football for 45 minutes.  Walking 1  miles in 35 minutes.  Pushing a stroller 1  miles in 30 minutes.  Playing basketball for 30 minutes.  Raking leaves for 30 minutes.  Bicycling 5 miles in 30 minutes.  Walking 2 miles in 30 minutes.  Dancing for 30 minutes.  Shoveling snow for 15 minutes.  Swimming laps for 20 minutes.  Walking up stairs for 15 minutes.  Bicycling 4 miles in 15 minutes.  Gardening for 30 to 45 minutes.  Jumping rope for 15 minutes.  Washing windows or  floors for 45 to 60 minutes. Document Released: 01/24/2010 Document Revised: 03/16/2011 Document Reviewed: 01/24/2010 [facility] Patient Information 2014 [place], [place].

## 2012-08-17 NOTE — Progress Notes (Signed)
Subjective:     Patient ID: Kari Ewing, female   DOB: 12-25-1950, 62 y.o.   MRN: [id number]  HPI   Kari Ewing  Jul 01, 1950 [id number]  Patient Care Team: Kari Fleet, MD as PCP - General (Family Medicine)  This patient is Ewing 62 y.o.female who presents today for surgical evaluation s/p removal of thigh mass 06/06/2012  The patient comes in with her daughter. She is feeling better.  No more drainage.  Soreness when she walks after Ewing while so she stops.  Not using any pain medicines.  No fevers or chills.  She is very appreciative of how things are going.  FINAL DIAGNOSIS Diagnosis Soft tissue mass, simple excision, left anterior thigh - NODULAR HYDRADENOMA. - SEE COMMENT. Microscopic Comment Drs. Kari Ewing and Kari Ewing of dermatopathology have reviewed the case and concur with the interpretation. (JBK:caf 06/10/12) Kari Leisure MD Pathologist, Electronic Signature (Case signed 06/10/2012) Specimen Kari Ewing and Clinical Information Specimen Comment Subcutaneous mass, 3 yr ulcer Specimen(s) Obtained: Soft tissue mass, simple excision, left anterior thigh Specimen Clinical Information R/O Kari Ewing ulcer Kari Ewing Received in formalin is Ewing 3.8 x 3.4 x 3.3 cm indurated fatty tissue with an overlying 4 x 2.2 cm tan-brown skin ellipse. The central skin has Ewing 2.1 x 2 x 0.3 cm roughened nodular area. The specimen is inked, and on sectioning has an underlying 3.4 x 2.4 x 2 cm well-defined nodular mass with tan-white firm to focally friable cut surfaces, and Ewing focal 1.4 cm in diameter cystic area containing clear fluid. Representative sections are submitted as follows: Ewing = section of cystic component. B, C = superficial halves including overlying nodular skin. D, E = deeper half sections. F = 2 radial sections of ends. Total of 6 blocks. (SSW:ecj 06/07/2012) 1 of 2 FINAL for Kari Ewing [phone]) Report signed out from the following location(s) [facility] [address],,[place] [id number].CLIA:[id number]., [facility] [address] [place], [place], [place] [address]. CLIA #: [id number], 2 of    Patient Active Problem List   Diagnosis Date Noted  . Ear pain 06/22/2012  . 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 Eye surgery      20 Years Ago.  . Mass excision Left [id number]    History   Social History  . Marital Status: Married    Spouse Name: N/Ewing    Number of Children: N/Ewing  . Years of Education: N/Ewing   Occupational History  . Not on file.   Social History Main Topics  . Smoking status: Never Smoker   . Smokeless tobacco: Never Used  . Alcohol Use: No  . Drug Use: No  . Sexual Activity: Not on file   Other Topics Concern  . Not on file   Social History Narrative   Originally from [place].  Relocated to [facility] 2012    History reviewed. No pertinent family history.  Current Outpatient Prescriptions  Medication Sig Dispense Refill  . acetaminophen (TYLENOL) 325 MG tablet Take 650 mg by mouth every 6 (six) hours as needed for pain.      . clotrimazole (LOTRIMIN) 1 % cream Apply twice daily for 4 weeks  60 g  0   No current facility-administered medications for this visit.     No Known Allergies  BP 102/66  Pulse 72  Resp 16  Ht 4\' 11"  (1.499 m)  Wt 154 lb (69.854 kg)  BMI 31.09 kg/m2  No results found.   Review of Systems  Constitutional: Negative for fever, chills and diaphoresis.  HENT: Negative for ear pain, sore throat and trouble swallowing.   Eyes: Negative for photophobia and visual disturbance.  Respiratory: Negative for cough and choking.   Cardiovascular: Negative for chest pain and palpitations.  Gastrointestinal: Negative for nausea, vomiting,  abdominal pain, diarrhea, constipation, anal bleeding and rectal pain.  Genitourinary: Negative for dysuria, frequency and difficulty urinating.  Musculoskeletal: Negative for myalgias and gait problem.  Skin: Positive for wound. Negative for color change, pallor and rash.  Neurological: Negative for dizziness, speech difficulty, weakness and numbness.  Hematological: Negative for adenopathy.  Psychiatric/Behavioral: Negative for confusion and agitation. The patient is not nervous/anxious.        Objective:   Physical Exam  Constitutional: She is oriented to person, place, and time. She appears well-developed and well-nourished. No distress.  HENT:  Head: Normocephalic.  Mouth/Throat: Oropharynx is clear and moist. No oropharyngeal exudate.  Eyes: Conjunctivae and EOM are normal. Pupils are equal, round, and reactive to light. No scleral icterus.  Neck: Normal range of motion. No tracheal deviation present.  Cardiovascular: Normal rate and intact distal pulses.   Pulmonary/Chest: Effort normal. No respiratory distress. She exhibits no tenderness.  Abdominal: Soft. She exhibits no distension. There is no tenderness. Hernia confirmed negative in the right inguinal area and confirmed negative in the left inguinal area.  Incisions clean with normal healing ridges.  No hernias  Genitourinary: No vaginal discharge found.  Musculoskeletal: Normal range of motion. She exhibits no tenderness.       Legs: Lymphadenopathy:       Right: No inguinal adenopathy present.       Left: No inguinal adenopathy present.  Neurological: She is alert and oriented to person, place, and time. No cranial nerve deficit. She exhibits normal muscle tone. Coordination normal.  Skin: Skin is warm and dry. No rash noted. She is not diaphoretic.  Psychiatric: Her behavior is normal. Her mood appears anxious.  Consolable       Assessment:     2 months s/p removal of benign cystic mass on left anterior thigh  (nodular hidradenoma) healed     Plan:     They are relieved it was benign.    Consider use her in orthopedic range to keep the incision supple and relaxed and not reopened.  Increase activity as tolerated to regular activity.  I strongly recommend she use over-the-counter pain medications to tolerate walking to ensure she continues to improve.  Low impact exercise such as walking an hour Ewing day at least ideal.  Do not push through pain.    F/u PRN.  Instructions discussed.  Followup with primary care physician for other health issues as would normally be done.  Questions answered.  The patient expressed understanding and appreciation

## 2012-10-03 ENCOUNTER — Encounter (INDEPENDENT_AMBULATORY_CARE_PROVIDER_SITE_OTHER): Payer: Self-pay

## 2012-10-04 ENCOUNTER — Encounter (INDEPENDENT_AMBULATORY_CARE_PROVIDER_SITE_OTHER): Payer: Self-pay

## 2012-10-05 ENCOUNTER — Ambulatory Visit: Payer: No Typology Code available for payment source | Attending: Family Medicine | Admitting: Internal Medicine

## 2012-10-05 VITALS — BP 132/80 | HR 73 | Temp 98.2°F | Resp 16 | Ht 59.0 in | Wt 157.0 lb

## 2012-10-05 DIAGNOSIS — K5900 Constipation, unspecified: Secondary | ICD-10-CM | POA: Insufficient documentation

## 2012-10-05 DIAGNOSIS — H9202 Otalgia, left ear: Secondary | ICD-10-CM

## 2012-10-05 DIAGNOSIS — R42 Dizziness and giddiness: Secondary | ICD-10-CM | POA: Insufficient documentation

## 2012-10-05 DIAGNOSIS — H9209 Otalgia, unspecified ear: Secondary | ICD-10-CM

## 2012-10-05 MED ORDER — ANTIPYRINE-BENZOCAINE 5.4-1.4 % OT SOLN: 3.0000 [drp] | Freq: Four times a day (QID) | OTIC | Status: AC | PRN

## 2012-10-05 MED ORDER — HYDROCORTISONE ACETATE 25 MG RE SUPP: 25.0000 mg | Freq: Two times a day (BID) | RECTAL | Status: AC

## 2012-10-05 MED ORDER — BENEFIBER PO POWD: 1.0000 | Freq: Every day | ORAL | Status: AC

## 2012-10-05 MED ORDER — POLYETHYLENE GLYCOL 3350 17 GM/SCOOP PO POWD: 17.0000 g | Freq: Every day | ORAL | Status: AC | PRN

## 2012-10-05 MED ORDER — SENNOSIDES-DOCUSATE SODIUM 8.6-50 MG PO TABS: 2.0000 | ORAL_TABLET | Freq: Every day | ORAL | Status: AC

## 2012-10-05 NOTE — Progress Notes (Signed)
Pt c/o dizziness and constipation. 

## 2012-10-12 ENCOUNTER — Ambulatory Visit: Payer: No Typology Code available for payment source | Attending: Internal Medicine

## 2012-10-12 VITALS — BP 103/69 | HR 70 | Temp 97.8°F | Resp 16

## 2012-10-12 DIAGNOSIS — K5900 Constipation, unspecified: Secondary | ICD-10-CM

## 2012-10-12 LAB — CBC WITH DIFFERENTIAL/PLATELET
Basophils Absolute: 0 10*3/uL (ref 0.0–0.1)
Basophils Relative: 1 % (ref 0–1)
Eosinophils Absolute: 0.1 10*3/uL (ref 0.0–0.7)
Hemoglobin: 12.7 g/dL (ref 12.0–15.0)
Lymphocytes Relative: 45 % (ref 12–46)
Lymphs Abs: 2 10*3/uL (ref 0.7–4.0)
MCH: 30 pg (ref 26.0–34.0)
MCV: 89.6 fL (ref 78.0–100.0)
Monocytes Relative: 7 % (ref 3–12)
Neutro Abs: 2 10*3/uL (ref 1.7–7.7)
Neutrophils Relative %: 45 % (ref 43–77)
Platelets: 279 10*3/uL (ref 150–400)
RBC: 4.23 MIL/uL (ref 3.87–5.11)
RDW: 13.7 % (ref 11.5–15.5)
WBC: 4.4 10*3/uL (ref 4.0–10.5)

## 2012-10-12 LAB — COMPREHENSIVE METABOLIC PANEL
ALT: 16 U/L (ref 0–35)
Albumin: 4.3 g/dL (ref 3.5–5.2)
Alkaline Phosphatase: 61 U/L (ref 39–117)
BUN: 14 mg/dL (ref 6–23)
CO2: 27 mEq/L (ref 19–32)
Calcium: 9.3 mg/dL (ref 8.4–10.5)
Chloride: 104 mEq/L (ref 96–112)
Creat: 0.85 mg/dL (ref 0.50–1.10)
Glucose, Bld: 109 mg/dL — ABNORMAL HIGH (ref 70–99)
Potassium: 4.7 mEq/L (ref 3.5–5.3)
Sodium: 138 mEq/L (ref 135–145)
Total Bilirubin: 0.5 mg/dL (ref 0.3–1.2)
Total Protein: 7.2 g/dL (ref 6.0–8.3)

## 2012-10-12 NOTE — Progress Notes (Unsigned)
Pt here for blood work Denies pain or sx at this visit

## 2012-10-13 LAB — TSH: TSH: 5.092 u[IU]/mL — ABNORMAL HIGH (ref 0.350–4.500)

## 2012-11-10 ENCOUNTER — Ambulatory Visit: Payer: No Typology Code available for payment source | Attending: Internal Medicine | Admitting: Internal Medicine

## 2012-11-10 ENCOUNTER — Encounter: Payer: Self-pay | Admitting: Internal Medicine

## 2012-11-10 VITALS — BP 127/85 | HR 71 | Temp 98.2°F | Resp 14 | Ht <= 58 in | Wt 156.0 lb

## 2012-11-10 DIAGNOSIS — K5900 Constipation, unspecified: Secondary | ICD-10-CM

## 2012-11-10 DIAGNOSIS — E039 Hypothyroidism, unspecified: Secondary | ICD-10-CM | POA: Insufficient documentation

## 2012-11-10 DIAGNOSIS — IMO0001 Reserved for inherently not codable concepts without codable children: Secondary | ICD-10-CM | POA: Insufficient documentation

## 2012-11-10 DIAGNOSIS — H04203 Unspecified epiphora, bilateral lacrimal glands: Secondary | ICD-10-CM

## 2012-11-10 DIAGNOSIS — H04209 Unspecified epiphora, unspecified lacrimal gland: Secondary | ICD-10-CM

## 2012-11-10 DIAGNOSIS — H538 Other visual disturbances: Secondary | ICD-10-CM | POA: Insufficient documentation

## 2012-11-10 MED ORDER — LEVOTHYROXINE SODIUM 50 MCG PO TABS: 50.0000 ug | ORAL_TABLET | Freq: Every day | ORAL | Status: DC

## 2012-11-10 NOTE — Patient Instructions (Signed)

## 2012-11-10 NOTE — Progress Notes (Signed)
Pt reports that she is feeling much better with no C.C. Today. Pt is requesting to review her lab results. Pt needs a medication refill and is asking to get another RX for AURALGAN.

## 2012-11-10 NOTE — Progress Notes (Signed)
Patient ID: Kari Ewing, female   DOB: 11/22/1950, 62 y.o.   MRN: [id number] Patient Demographics  Kari Ewing, is a 62 y.o. female  NWG:[id number]  [id number]  DOB - 31-Mar-1950  Chief Complaint  Patient presents with  . Follow-up        Subjective:   Kari Ewing is a 62 y.o. female here today for a follow up visit. She has no specific complaint today, she said she feels much better. She is here to know the results of her lab tests including a thyroid function test. She does not smoke cigarette,. She does not drink alcohol. Patient has No headache, No chest pain, No abdominal pain - No Nausea, No new weakness tingling or numbness, No Cough - SOB.  ALLERGIES: No Known Allergies  PAST MEDICAL HISTORY: Past Medical History  Diagnosis Date  . 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 Authorizing Provider  acetaminophen (TYLENOL) 325 MG tablet Take 650 mg by mouth every 6 (six) hours as needed for pain.   Yes Historical Provider, MD  hydrocortisone (ANUSOL-HC) 25 MG suppository Place 1 suppository (25 mg total) rectally 2 (two) times daily. 10/05/12  Yes Ripudeep Jenna Luo, MD  polyethylene glycol powder (GLYCOLAX/MIRALAX) powder Take 17 g by mouth daily as needed (if no BM in 2 days). 10/05/12  Yes Ripudeep Jenna Luo, MD  antipyrine-benzocaine Lyla Son) otic solution Place 3 drops into the right ear 4 (four) times daily as needed for pain. 10/05/12   Ripudeep Jenna Luo, MD  clotrimazole (LOTRIMIN) 1 % cream Apply twice daily for 4 weeks 06/14/12   Acey Lav, MD  levothyroxine (SYNTHROID, LEVOTHROID) 50 MCG tablet Take 1 tablet (50 mcg total) by mouth daily. 11/10/12   Jeanann Lewandowsky, MD  senna-docusate (SENOKOT S) 8.6-50 MG per tablet Take 2 tablets by mouth at bedtime. 10/05/12   Ripudeep Jenna Luo, MD  Wheat Dextrin (BENEFIBER) POWD Take 1 scoop by  mouth daily. Also available over the counter 10/05/12   Ripudeep Jenna Luo, MD     Objective:   Filed Vitals:   11/10/12 1700  BP: 127/85  Pulse: 71  Temp: 98.2 F (36.8 C)  TempSrc: Oral  Resp: 14  Height: 4\' 10"  (1.473 m)  Weight: 156 lb (70.761 kg)  SpO2: 97%    Exam General appearance : Awake, alert, not in any distress. Speech Clear. Not toxic looking HEENT: Atraumatic and Normocephalic, pupils equally reactive to light and accomodation Neck: supple, no JVD. No cervical lymphadenopathy.  Chest:Good air entry bilaterally, no added sounds  CVS: S1 S2 regular, no murmurs.  Abdomen: Bowel sounds present, Non tender and not distended with no gaurding, rigidity or rebound. Extremities: B/L Lower Ext shows no edema, both legs are warm to touch Neurology: Awake alert, and oriented X 3, CN II-XII intact, Non focal Skin:No Rash Wounds:N/A   Data Review   CBC No results found for this basename: WBC, HGB, HCT, PLT, MCV, MCH, MCHC, RDW, NEUTRABS, LYMPHSABS, MONOABS, EOSABS, BASOSABS, BANDABS, BANDSABD,  in the last 168 hours  Chemistries   No results found for this basename: NA, K, CL, CO2, GLUCOSE, BUN, CREATININE, GFRCGP, CALCIUM, MG, AST, ALT, ALKPHOS, BILITOT,  in the last 168 hours ------------------------------------------------------------------------------------------------------------------ No results found for this basename: HGBA1C,  in the last 72 hours ------------------------------------------------------------------------------------------------------------------ No results found for this basename: CHOL, HDL, LDLCALC, TRIG, CHOLHDL, LDLDIRECT,  in  the last 72 hours ------------------------------------------------------------------------------------------------------------------ No results found for this basename: TSH, T4TOTAL, FREET3, T3FREE, THYROIDAB,  in the last 72  hours ------------------------------------------------------------------------------------------------------------------ No results found for this basename: VITAMINB12, FOLATE, FERRITIN, TIBC, IRON, RETICCTPCT,  in the last 72 hours  Coagulation profile  No results found for this basename: INR, PROTIME,  in the last 168 hours    Assessment & Plan   Patient Active Problem List   Diagnosis Date Noted  . Unspecified hypothyroidism 11/10/2012  . Blurry vision, bilateral 11/10/2012  . Tearing 11/10/2012  . Unspecified constipation 10/05/2012  . Ear pain 06/22/2012  . 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   Plan: Refill all medications Ambulatory referral to ophthalmologist  Patient extensively counseled about nutrition and exercise  Follow up in 3 months or when necessary   The patient was given clear instructions to go to ER or return to medical center if symptoms don't improve, worsen or new problems develop. The patient verbalized understanding. The patient was told to call to get lab results if they haven't heard anything in the next week.    Jeanann Lewandowsky, MD, MHA, FACP, FAAP [facility] [place], [place] [phone]   11/10/2012, 5:13 PM

## 2012-11-11 LAB — T4, FREE: Free T4: 1.02 ng/dL (ref 0.80–1.80)

## 2012-12-12 ENCOUNTER — Ambulatory Visit: Payer: Self-pay | Attending: Internal Medicine

## 2012-12-27 ENCOUNTER — Telehealth: Payer: Self-pay | Admitting: Emergency Medicine

## 2012-12-27 DIAGNOSIS — E039 Hypothyroidism, unspecified: Secondary | ICD-10-CM

## 2012-12-27 MED ORDER — LEVOTHYROXINE SODIUM 50 MCG PO TABS: 50.0000 ug | ORAL_TABLET | Freq: Every day | ORAL | Status: DC

## 2012-12-27 NOTE — Telephone Encounter (Signed)
Pt given lab results via daughter interpretor. T4 normal. Pt instructed to continue taking Levothyroxine 50 mcg daily per Dr. Hyman Hopes

## 2013-02-06 ENCOUNTER — Ambulatory Visit: Payer: Self-pay | Attending: Internal Medicine | Admitting: Internal Medicine

## 2013-02-06 ENCOUNTER — Encounter: Payer: Self-pay | Admitting: Internal Medicine

## 2013-02-06 VITALS — BP 139/81 | HR 73 | Temp 98.1°F | Resp 16 | Ht <= 58 in | Wt 160.0 lb

## 2013-02-06 DIAGNOSIS — H538 Other visual disturbances: Secondary | ICD-10-CM | POA: Insufficient documentation

## 2013-02-06 DIAGNOSIS — R42 Dizziness and giddiness: Secondary | ICD-10-CM | POA: Insufficient documentation

## 2013-02-06 DIAGNOSIS — R519 Headache, unspecified: Secondary | ICD-10-CM | POA: Insufficient documentation

## 2013-02-06 DIAGNOSIS — R51 Headache: Secondary | ICD-10-CM | POA: Insufficient documentation

## 2013-02-06 DIAGNOSIS — E039 Hypothyroidism, unspecified: Secondary | ICD-10-CM | POA: Insufficient documentation

## 2013-02-06 LAB — BASIC METABOLIC PANEL
BUN: 14 mg/dL (ref 6–23)
CALCIUM: 10.1 mg/dL (ref 8.4–10.5)
CO2: 28 meq/L (ref 19–32)
Chloride: 105 mEq/L (ref 96–112)
Creat: 0.83 mg/dL (ref 0.50–1.10)
GLUCOSE: 99 mg/dL (ref 70–99)
POTASSIUM: 4.9 meq/L (ref 3.5–5.3)
Sodium: 141 mEq/L (ref 135–145)

## 2013-02-06 LAB — POCT GLYCOSYLATED HEMOGLOBIN (HGB A1C): Hemoglobin A1C: 5.4

## 2013-02-06 NOTE — Progress Notes (Signed)
Pt reports that she has been having extreme headaches and having dizziness.

## 2013-02-06 NOTE — Progress Notes (Signed)
Patient ID: Kari Ewing, female   DOB: 02-06-1950, 63 y.o.   MRN: [id number] Patient Demographics  Kari Ewing, is a 63 y.o. female  WPY:[id number]  [id number]  DOB - 26-Jun-1950  No chief complaint on file.       Subjective:   Kari Ewing is a 63 y.o. female here today for a follow up visit. Patient has no significant past medical history except for nonspecific hypothyroidism with high TSH and relatively normal free T4. Patient is on levothyroxine 50 mcg tablet by mouth daily. Her major complaint of a severe headache which has been there for years but recently got worse and associated with dizziness and frequent fall. She had falling down 2 times in the last 6 months. Headache is associated with blurry vision. It's worse in the morning progressed through the day but it is slightly of less intensity. She denies any nausea or vomiting.  No history of head injury or concussion. Patient has No chest pain, No abdominal pain , No new weakness tingling or numbness, No Cough - SOB.  ALLERGIES: No Known Allergies  PAST MEDICAL HISTORY: Past Medical History  Diagnosis Date  . 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 Authorizing Provider  levothyroxine (SYNTHROID, LEVOTHROID) 50 MCG tablet Take 1 tablet (50 mcg total) by mouth daily. 12/27/12  Yes Angelica Chessman, MD  acetaminophen (TYLENOL) 325 MG tablet Take 650 mg by mouth every 6 (six) hours as needed for pain.    Historical Provider, MD  antipyrine-benzocaine Toniann Fail) otic solution Place 3 drops into the right ear 4 (four) times daily as needed for pain. 10/05/12   Ripudeep Krystal Eaton, MD  clotrimazole (LOTRIMIN) 1 % cream Apply twice daily for 4 weeks 06/14/12   Doran Heater, MD  hydrocortisone (ANUSOL-HC) 25 MG suppository Place 1 suppository (25 mg total) rectally 2 (two) times daily. 10/05/12    Ripudeep Krystal Eaton, MD  polyethylene glycol powder (GLYCOLAX/MIRALAX) powder Take 17 g by mouth daily as needed (if no BM in 2 days). 10/05/12   Ripudeep Krystal Eaton, MD  senna-docusate (SENOKOT S) 8.6-50 MG per tablet Take 2 tablets by mouth at bedtime. 10/05/12   Ripudeep Krystal Eaton, MD  Wheat Dextrin (BENEFIBER) POWD Take 1 scoop by mouth daily. Also available over the counter 10/05/12   Ripudeep Krystal Eaton, MD     Objective:   Filed Vitals:   02/06/13 1123  BP: 139/81  Pulse: 73  Temp: 98.1 F (36.7 C)  TempSrc: Oral  Resp: 16  Height: 4\' 10"  (1.473 m)  Weight: 160 lb (72.576 kg)  SpO2: 96%    Exam General appearance : Awake, alert, not in any distress. Speech Clear. Not toxic looking HEENT: Atraumatic and Normocephalic, pupils equally reactive to light and accomodation Neck: supple, no JVD. No cervical lymphadenopathy.  Chest:Good air entry bilaterally, no added sounds  CVS: S1 S2 regular, no murmurs.  Abdomen: Bowel sounds present, Non tender and not distended with no gaurding, rigidity or rebound. Extremities: B/L Lower Ext shows no edema, both legs are warm to touch Neurology: Awake alert, and oriented X 3, CN II-XII intact, Non focal Skin:No Rash Wounds:N/A   Data Review   CBC No results found for this basename: WBC, HGB, HCT, PLT, MCV, MCH, MCHC, RDW, NEUTRABS, LYMPHSABS, MONOABS, EOSABS, BASOSABS, BANDABS, BANDSABD,  in the last 168 hours  Chemistries   No results  found for this basename: NA, K, CL, CO2, GLUCOSE, BUN, CREATININE, GFRCGP, CALCIUM, MG, AST, ALT, ALKPHOS, BILITOT,  in the last 168 hours ------------------------------------------------------------------------------------------------------------------ No results found for this basename: HGBA1C,  in the last 72 hours ------------------------------------------------------------------------------------------------------------------ No results found for this basename: CHOL, HDL, LDLCALC, TRIG, CHOLHDL, LDLDIRECT,  in the  last 72 hours ------------------------------------------------------------------------------------------------------------------ No results found for this basename: TSH, T4TOTAL, FREET3, T3FREE, THYROIDAB,  in the last 72 hours ------------------------------------------------------------------------------------------------------------------ No results found for this basename: VITAMINB12, FOLATE, FERRITIN, TIBC, IRON, RETICCTPCT,  in the last 72 hours  Coagulation profile  No results found for this basename: INR, PROTIME,  in the last 168 hours    Assessment & Plan   1. Unspecified hypothyroidism Continue levothyroxin 50 mcg tablet by mouth daily  2. Blurry vision, bilateral Management will depend on MRI finding  3. Headache(784.0) chronic - MR Brain W Wo Contrast; Future  4. Dizziness and giddiness  - MR Brain W Wo Contrast; Future - HgB A1c - Vitamin D, 25-hydroxy  Patient extensively counseled about nutrition and exercise  Follow up in 3 months or when necessary  The patient was given clear instructions to go to ER or return to medical center if symptoms don't improve, worsen or new problems develop. The patient verbalized understanding. The patient was told to call to get lab results if they haven't heard anything in the next week.    Angelica Chessman, MD, [place], [place], [place] and [place], [place]   02/06/2013, 12:01 PM

## 2013-02-07 LAB — VITAMIN D 25 HYDROXY (VIT D DEFICIENCY, FRACTURES): Vit D, 25-Hydroxy: 26 ng/mL — ABNORMAL LOW (ref 30–89)

## 2013-02-08 ENCOUNTER — Telehealth: Payer: Self-pay | Admitting: *Deleted

## 2013-02-08 NOTE — Telephone Encounter (Signed)
Message copied by Joan Mayans on Wed Feb 08, 2013  5:49 PM ------      Message from: Tresa Garter      Created: Tue Feb 07, 2013  5:12 PM       Please inform patient that her laboratory tests are within normal limit except for vitamin D that is slightly low. We advised over-the-counter vitamin D 1 tablet daily. ------

## 2013-02-08 NOTE — Telephone Encounter (Signed)
Left message 1st attempt.  

## 2013-02-10 ENCOUNTER — Ambulatory Visit: Payer: Self-pay | Admitting: Internal Medicine

## 2013-02-17 ENCOUNTER — Ambulatory Visit (HOSPITAL_COMMUNITY): Payer: Self-pay

## 2013-05-09 ENCOUNTER — Ambulatory Visit: Payer: Self-pay | Admitting: Internal Medicine

## 2013-09-04 ENCOUNTER — Ambulatory Visit: Payer: Self-pay | Admitting: Internal Medicine

## 2014-01-31 ENCOUNTER — Ambulatory Visit: Payer: No Typology Code available for payment source | Attending: Internal Medicine | Admitting: Physician Assistant

## 2014-01-31 VITALS — BP 124/74 | HR 81 | Temp 97.8°F | Resp 22 | Ht 59.0 in | Wt 167.4 lb

## 2014-01-31 DIAGNOSIS — M5440 Lumbago with sciatica, unspecified side: Secondary | ICD-10-CM

## 2014-01-31 DIAGNOSIS — M545 Low back pain: Secondary | ICD-10-CM | POA: Insufficient documentation

## 2014-01-31 DIAGNOSIS — R635 Abnormal weight gain: Secondary | ICD-10-CM | POA: Insufficient documentation

## 2014-01-31 DIAGNOSIS — R51 Headache: Secondary | ICD-10-CM

## 2014-01-31 DIAGNOSIS — R519 Headache, unspecified: Secondary | ICD-10-CM

## 2014-01-31 DIAGNOSIS — Z9114 Patient's other noncompliance with medication regimen: Secondary | ICD-10-CM | POA: Insufficient documentation

## 2014-01-31 LAB — CBC WITH DIFFERENTIAL/PLATELET
Basophils Absolute: 0.1 10*3/uL (ref 0.0–0.1)
Basophils Relative: 1 % (ref 0–1)
Eosinophils Absolute: 0.1 10*3/uL (ref 0.0–0.7)
Eosinophils Relative: 1 % (ref 0–5)
HCT: 40 % (ref 36.0–46.0)
HEMOGLOBIN: 13.2 g/dL (ref 12.0–15.0)
LYMPHS PCT: 34 % (ref 12–46)
Lymphs Abs: 2.1 10*3/uL (ref 0.7–4.0)
MCH: 30.6 pg (ref 26.0–34.0)
MCHC: 33 g/dL (ref 30.0–36.0)
MCV: 92.6 fL (ref 78.0–100.0)
MPV: 10.7 fL (ref 8.6–12.4)
Monocytes Absolute: 0.4 10*3/uL (ref 0.1–1.0)
Monocytes Relative: 7 % (ref 3–12)
NEUTROS PCT: 57 % (ref 43–77)
Neutro Abs: 3.6 10*3/uL (ref 1.7–7.7)
Platelets: 259 10*3/uL (ref 150–400)
RBC: 4.32 MIL/uL (ref 3.87–5.11)
RDW: 13.2 % (ref 11.5–15.5)
WBC: 6.3 10*3/uL (ref 4.0–10.5)

## 2014-01-31 LAB — BASIC METABOLIC PANEL
BUN: 14 mg/dL (ref 6–23)
CALCIUM: 9.5 mg/dL (ref 8.4–10.5)
CHLORIDE: 105 meq/L (ref 96–112)
CO2: 27 mEq/L (ref 19–32)
CREATININE: 0.85 mg/dL (ref 0.50–1.10)
GLUCOSE: 122 mg/dL — AB (ref 70–99)
Potassium: 4.7 mEq/L (ref 3.5–5.3)
Sodium: 140 mEq/L (ref 135–145)

## 2014-01-31 LAB — TSH: TSH: 5.428 u[IU]/mL — ABNORMAL HIGH (ref 0.350–4.500)

## 2014-01-31 LAB — T4, FREE: Free T4: 1 ng/dL (ref 0.80–1.80)

## 2014-01-31 NOTE — Progress Notes (Signed)
Spoke with patient via [employer], [place], [place] Presents with daughter for 7 day hx headache, back pain and joint pain; Unable to rate pain on scale 1-10, states " a lot" Will take flu vaccine

## 2014-01-31 NOTE — Progress Notes (Signed)
Chief Complaint: Headache, back pain, weight gain  Subjective: 64 yo [place] female here to reestablish care and complaining acutely of weight gain, headaches, and back pain. She has not taken her thyroid medications in several months. She was under the impression that it was only a one month prescription. She has had significant weight gain. ?pounds. She also is cold all of the time and having some vision issues.  Her headaches have been worse for 1 week. Frontal. No radiation. Comes and goes. It appears that she had a similar presentation before and was referred for a MRI but she did not go.  Her back pain is lower back with some numbness and tingling down bilateral legs.    ROS:  GEN: denies fever or chills, +change in weight Skin: denies lesions or rashes HEENT: denies headache, earache, epistaxis, sore throat, or neck pain +blurred vsision LUNGS: denies SHOB, dyspnea, PND, orthopnea CV: denies CP or palpitations ABD: denies abd pain, N or V EXT: denies muscle spasms or swelling; no pain in lower ext, no weakness NEURO: + numbness or tingling, denies sz, stroke or TIA   Objective:  Filed Vitals:   01/31/14 1416  BP: 124/74  Pulse: 81  Temp: 97.8 F (36.6 C)  TempSrc: Oral  Resp: 22  Height: 4\' 11"  (1.499 m)  Weight: 167 lb 6.4 oz (75.932 kg)  SpO2: 97%    Physical Exam:  General: in no acute distress. HEENT: no pallor, no icterus, moist oral mucosa, no JVD, no lymphadenopathy Heart: Normal  s1 &s2  Regular rate and rhythm, without murmurs, rubs, gallops. Lungs: Clear to auscultation bilaterally. Abdomen: Soft, nontender, nondistended, positive bowel sounds. Extremities: No clubbing cyanosis or edema with positive pedal pulses. Neuro: Alert, awake, oriented x3, nonfocal.  Pertinent Lab Results:none   Medications: Prior to Admission medications   Medication Sig Start Date End Date Taking? Authorizing Provider  acetaminophen (TYLENOL) 325 MG tablet Take 650 mg  by mouth every 6 (six) hours as needed for pain.   Yes Historical Provider, MD  antipyrine-benzocaine Toniann Fail) otic solution Place 3 drops into the right ear 4 (four) times daily as needed for pain. Patient not taking: Reported on 01/31/2014 10/05/12   Ripudeep Krystal Eaton, MD  clotrimazole (LOTRIMIN) 1 % cream Apply twice daily for 4 weeks Patient not taking: Reported on 01/31/2014 06/14/12   Doran Heater, MD  hydrocortisone (ANUSOL-HC) 25 MG suppository Place 1 suppository (25 mg total) rectally 2 (two) times daily. Patient not taking: Reported on 01/31/2014 10/05/12   Ripudeep Krystal Eaton, MD  levothyroxine (SYNTHROID, LEVOTHROID) 50 MCG tablet Take 1 tablet (50 mcg total) by mouth daily. Patient not taking: Reported on 01/31/2014 12/27/12   Tresa Garter, MD  polyethylene glycol powder (GLYCOLAX/MIRALAX) powder Take 17 g by mouth daily as needed (if no BM in 2 days). Patient not taking: Reported on 01/31/2014 10/05/12   Ripudeep Krystal Eaton, MD  senna-docusate (SENOKOT S) 8.6-50 MG per tablet Take 2 tablets by mouth at bedtime. Patient not taking: Reported on 01/31/2014 10/05/12   Ripudeep Krystal Eaton, MD  Wheat Dextrin (BENEFIBER) POWD Take 1 scoop by mouth daily. Also available over the counter Patient not taking: Reported on 01/31/2014 10/05/12   Ripudeep Krystal Eaton, MD    Assessment: 1. Weight gain likely due to noncompliance with thyroid regimen 2. Low back pain 3. Headaches  Plan: CBC, BMP, TSH, T4 Will call with results and likely restart Synthroid MRI of brain and lumbar spine 2 week follow up  Financial assistance referral  Follow up:2 weeks  The patient was given clear instructions to go to ER or return to medical center if symptoms don't improve, worsen or new problems develop. The patient verbalized understanding. The patient was told to call to get lab results if they haven't heard anything in the next week.   This note has been created with Automotive engineer. Any transcriptional errors are unintentional.   Zettie Pho, PA-C 01/31/2014, 2:45 PM

## 2014-01-31 NOTE — Patient Instructions (Signed)
1. Please go this week to have the 2 MRIs done 2. Please keep your next follow up appt 3. We will call you with the blood work results. We may need to restart meds for your thyroid.

## 2014-02-02 ENCOUNTER — Other Ambulatory Visit: Payer: Self-pay | Admitting: Physician Assistant

## 2014-02-02 DIAGNOSIS — E039 Hypothyroidism, unspecified: Secondary | ICD-10-CM

## 2014-02-02 MED ORDER — LEVOTHYROXINE SODIUM 50 MCG PO TABS: 50.0000 ug | ORAL_TABLET | Freq: Every day | ORAL | Status: DC

## 2014-02-05 ENCOUNTER — Telehealth: Payer: Self-pay | Admitting: *Deleted

## 2014-02-05 NOTE — Telephone Encounter (Signed)
Patient 's daughter notified of lab results. Will pick up Rx for synthroid tomorrow when she comes for financial counseling appt. MRIs will be scheduled once she has [employer]. Daughter will schedule appt for pt with PCP for 2 weeks from last visit

## 2014-02-05 NOTE — Telephone Encounter (Signed)
-----   Message from Brayton Caves, [place] sent at 02/02/2014  7:46 AM EST ----- Hi Lauren,  Please speak to her daughter. Labs are ok. TSH is slighlty abnormal but normal T4. Start back on Synthroid 50 mcg because of her clinical sxs. I will send to our pharmacy. Encourage to have the MRIs. Encourage and make an appt with [facility] in 2 weeks.   ----- Message -----    From: Lab in [facility] Interface    Sent: 01/31/2014  11:07 PM      To: Brayton Caves, PA-C

## 2014-02-05 NOTE — Telephone Encounter (Signed)
Left message on patient's VM via [employer], [place], [employer] to return call to discuss lab results

## 2014-02-15 ENCOUNTER — Encounter: Payer: Self-pay | Admitting: Internal Medicine

## 2014-02-15 ENCOUNTER — Ambulatory Visit: Payer: Self-pay | Attending: Internal Medicine | Admitting: Internal Medicine

## 2014-02-15 VITALS — BP 126/85 | HR 70 | Temp 97.8°F | Resp 14 | Ht 59.0 in | Wt 165.0 lb

## 2014-02-15 DIAGNOSIS — R51 Headache: Secondary | ICD-10-CM | POA: Insufficient documentation

## 2014-02-15 DIAGNOSIS — R519 Headache, unspecified: Secondary | ICD-10-CM | POA: Insufficient documentation

## 2014-02-15 DIAGNOSIS — E039 Hypothyroidism, unspecified: Secondary | ICD-10-CM | POA: Insufficient documentation

## 2014-02-15 LAB — POCT GLYCOSYLATED HEMOGLOBIN (HGB A1C): Hemoglobin A1C: 5.8

## 2014-02-15 NOTE — Progress Notes (Signed)
Patient ID: Kari Ewing, female   DOB: Oct 20, 1950, 64 y.o.   MRN: [id number]   Kari Ewing, is a 64 y.o. female  [id number]  [id number]  DOB - 1950/09/21  Chief Complaint  Patient presents with  . Follow-up        Subjective:   Kari Ewing is a 64 y.o. female here today for a follow up visit. Patient was recently seen in our walk-in clinic for headaches and back pain associated with weight gain. Her only medical history significant for hypothyroidism on levothyroxine 50 g tablet by mouth daily. Headache persists despite medications, slightly getting worse, mostly frontal, no associated nausea or vomiting. She had been referred to do an MRI in the past for the same headache but patient did not make it to the appointment. Patient has No chest pain, No abdominal pain - No Nausea, No new weakness tingling or numbness, No Cough - SOB.  Problem  Frequent Headaches    ALLERGIES: No Known Allergies  PAST MEDICAL HISTORY: Past Medical History  Diagnosis Date  . 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 Authorizing Provider  acetaminophen (TYLENOL) 325 MG tablet Take 650 mg by mouth every 6 (six) hours as needed for pain.   Yes Historical Provider, MD  levothyroxine (SYNTHROID, LEVOTHROID) 50 MCG tablet Take 1 tablet (50 mcg total) by mouth daily. 02/02/14  Yes Tiffany Daneil Dan, PA-C  antipyrine-benzocaine Toniann Fail) otic solution Place 3 drops into the right ear 4 (four) times daily as needed for pain. Patient not taking: Reported on 01/31/2014 10/05/12   Ripudeep Krystal Eaton, MD  clotrimazole (LOTRIMIN) 1 % cream Apply twice daily for 4 weeks Patient not taking: Reported on 01/31/2014 06/14/12   Doran Heater, MD  hydrocortisone (ANUSOL-HC) 25 MG suppository Place 1 suppository (25 mg total) rectally 2 (two) times daily. Patient not taking:  Reported on 01/31/2014 10/05/12   Ripudeep Krystal Eaton, MD  polyethylene glycol powder (GLYCOLAX/MIRALAX) powder Take 17 g by mouth daily as needed (if no BM in 2 days). Patient not taking: Reported on 01/31/2014 10/05/12   Ripudeep Krystal Eaton, MD  senna-docusate (SENOKOT S) 8.6-50 MG per tablet Take 2 tablets by mouth at bedtime. Patient not taking: Reported on 01/31/2014 10/05/12   Ripudeep Krystal Eaton, MD  Wheat Dextrin (BENEFIBER) POWD Take 1 scoop by mouth daily. Also available over the counter Patient not taking: Reported on 01/31/2014 10/05/12   Ripudeep Krystal Eaton, MD     Objective:   Filed Vitals:   02/15/14 1013  BP: 126/85  Pulse: 70  Temp: 97.8 F (36.6 C)  TempSrc: Oral  Resp: 14  Height: 4\' 11"  (1.499 m)  Weight: 165 lb (74.844 kg)  SpO2: 97%    Exam General appearance : Awake, alert, not in any distress. Speech Clear. Not toxic looking HEENT: Atraumatic and Normocephalic, pupils equally reactive to light and accomodation Neck: supple, no JVD. No cervical lymphadenopathy.  Chest:Good air entry bilaterally, no added sounds  CVS: S1 S2 regular, no murmurs.  Abdomen: Bowel sounds present, Non tender and not distended with no gaurding, rigidity or rebound. Extremities: B/L Lower Ext shows no edema, both legs are warm to touch Neurology: Awake alert, and oriented X 3, CN II-XII intact, Non focal Skin:No Rash Wounds:N/A  Data Review Lab Results  Component Value Date   HGBA1C 5.4 02/06/2013     Assessment &  Plan   1. Frequent headaches Because of patient's concern and the pattern of her headaches and also with her age, will obtain an imaging studies to rule out any space-occupying lesion.  - POCT glycosylated hemoglobin (Hb A1C) - MR Brain W Wo Contrast; Future   Patient was counseled extensively about nutrition and exercise   Return in about 6 months (around 08/16/2014), or if symptoms worsen or fail to improve, for Hypothyroidism.  The patient was given clear instructions to go to  ER or return to medical center if symptoms don't improve, worsen or new problems develop. The patient verbalized understanding. The patient was told to call to get lab results if they haven't heard anything in the next week.   This note has been created with Surveyor, quantity. Any transcriptional errors are unintentional.    Angelica Chessman, MD, [place], [place], [place] and [facility] [place], [place]   02/15/2014, 11:09 AM

## 2014-02-15 NOTE — Progress Notes (Signed)
Pt is here c/o lower back pain and pain in her legs. Pt recently received the  [facility] and is requesting a MRI.

## 2014-02-15 NOTE — Patient Instructions (Signed)

## 2014-03-12 ENCOUNTER — Ambulatory Visit (HOSPITAL_COMMUNITY)
Admission: RE | Admit: 2014-03-12 | Discharge: 2014-03-12 | Disposition: A | Discharge status: Home / Self Care | Payer: Self-pay | Source: Ambulatory Visit | Attending: Internal Medicine | Admitting: Internal Medicine

## 2014-03-12 DIAGNOSIS — H538 Other visual disturbances: Secondary | ICD-10-CM | POA: Insufficient documentation

## 2014-03-12 DIAGNOSIS — R51 Headache: Secondary | ICD-10-CM | POA: Insufficient documentation

## 2014-03-12 DIAGNOSIS — R519 Headache, unspecified: Secondary | ICD-10-CM

## 2014-03-12 MED ORDER — GADOBENATE DIMEGLUMINE 529 MG/ML IV SOLN
15.0000 mL | Freq: Once | INTRAVENOUS | Status: AC
  Administered 2014-03-12: 15 mL via INTRAVENOUS

## 2014-03-16 ENCOUNTER — Telehealth: Payer: Self-pay | Admitting: Emergency Medicine

## 2014-03-16 NOTE — Telephone Encounter (Signed)
-----   Message from Tresa Garter, MD sent at 03/13/2014  8:49 AM EST ----- Please inform patient that her brain MRI shows no acute process.

## 2014-03-23 ENCOUNTER — Telehealth: Payer: Self-pay | Admitting: Emergency Medicine

## 2014-03-23 NOTE — Telephone Encounter (Signed)
Left message with negative MRI results per [facility] interpretor ID# [phone]

## 2014-06-28 ENCOUNTER — Ambulatory Visit: Payer: Self-pay | Attending: Internal Medicine | Admitting: Internal Medicine

## 2014-06-28 ENCOUNTER — Encounter: Payer: Self-pay | Admitting: Internal Medicine

## 2014-06-28 VITALS — BP 129/69 | HR 78 | Temp 98.0°F | Resp 18 | Ht 58.5 in | Wt 167.6 lb

## 2014-06-28 DIAGNOSIS — M25551 Pain in right hip: Secondary | ICD-10-CM | POA: Insufficient documentation

## 2014-06-28 DIAGNOSIS — M545 Low back pain, unspecified: Secondary | ICD-10-CM | POA: Insufficient documentation

## 2014-06-28 DIAGNOSIS — M5440 Lumbago with sciatica, unspecified side: Secondary | ICD-10-CM | POA: Insufficient documentation

## 2014-06-28 DIAGNOSIS — E039 Hypothyroidism, unspecified: Secondary | ICD-10-CM | POA: Insufficient documentation

## 2014-06-28 MED ORDER — LEVOTHYROXINE SODIUM 50 MCG PO TABS: 50.0000 ug | ORAL_TABLET | Freq: Every day | ORAL | Status: AC

## 2014-06-28 MED ORDER — NAPROXEN 500 MG PO TABS: 500.0000 mg | ORAL_TABLET | Freq: Two times a day (BID) | ORAL | Status: AC

## 2014-06-28 NOTE — Progress Notes (Signed)
Patient ID: Kari Ewing, female   DOB: 1950-03-06, 64 y.o.   MRN: [id number]   Kari Ewing, is a 64 y.o. female  [id number]  [id number]  DOB - 1950/05/28  Chief Complaint  Patient presents with  . Follow-up        Subjective:   Kari Ewing is a 64 y.o. female here today for a follow up visit. Patient has medical history of hypothyroidism on levothyroxine 50 micrograms tablet by mouth daily. She is here today with major complaint of ongoing pain in her legs, from her right hip joint radiating all the way to the sole of her feet. Pain is aggravated by walking . Pain is rated at 8 out of 10, intermittent. It's been going on for about 5 years now. She feels occasionally dizzy. Her headache is much better today. Patient has No chest pain, No abdominal pain - No Nausea, No new weakness tingling or numbness, No Cough - SOB. MRI brain in March 2016 shows no acute or reversible  process.  Problem  Thyroid Activity Decreased  Lumbago  Right Hip Pain    ALLERGIES: No Known Allergies  PAST MEDICAL HISTORY: Past Medical History  Diagnosis Date  . 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 Authorizing Provider  acetaminophen (TYLENOL) 325 MG tablet Take 650 mg by mouth every 6 (six) hours as needed for pain.   Yes Historical Provider, MD  levothyroxine (SYNTHROID, LEVOTHROID) 50 MCG tablet Take 1 tablet (50 mcg total) by mouth daily. 06/28/14  Yes Tresa Garter, MD  antipyrine-benzocaine Toniann Fail) otic solution Place 3 drops into the right ear 4 (four) times daily as needed for pain. Patient not taking: Reported on 01/31/2014 10/05/12   Ripudeep Krystal Eaton, MD  clotrimazole (LOTRIMIN) 1 % cream Apply twice daily for 4 weeks Patient not taking: Reported on 01/31/2014 06/14/12   Doran Heater, MD  hydrocortisone (ANUSOL-HC) 25 MG suppository  Place 1 suppository (25 mg total) rectally 2 (two) times daily. Patient not taking: Reported on 01/31/2014 10/05/12   Ripudeep Krystal Eaton, MD  naproxen (NAPROSYN) 500 MG tablet Take 1 tablet (500 mg total) by mouth 2 (two) times daily with a meal. 06/28/14   Tresa Garter, MD  polyethylene glycol powder (GLYCOLAX/MIRALAX) powder Take 17 g by mouth daily as needed (if no BM in 2 days). Patient not taking: Reported on 01/31/2014 10/05/12   Ripudeep Krystal Eaton, MD  senna-docusate (SENOKOT S) 8.6-50 MG per tablet Take 2 tablets by mouth at bedtime. Patient not taking: Reported on 01/31/2014 10/05/12   Ripudeep Krystal Eaton, MD  Wheat Dextrin (BENEFIBER) POWD Take 1 scoop by mouth daily. Also available over the counter Patient not taking: Reported on 01/31/2014 10/05/12   Ripudeep Krystal Eaton, MD     Objective:   Filed Vitals:   06/28/14 1405  BP: 129/69  Pulse: 78  Temp: 98 F (36.7 C)  TempSrc: Oral  Resp: 18  Height: 4' 10.5" (1.486 m)  Weight: 167 lb 9.6 oz (76.023 kg)  SpO2: 95%    Exam General appearance : Awake, alert, not in any distress. Speech Clear. Not toxic looking, Obese HEENT: Atraumatic and Normocephalic, pupils equally reactive to light and accomodation Neck: supple, no JVD. No cervical lymphadenopathy.  Chest:Good air entry bilaterally, no added sounds  CVS: S1 S2 regular, no murmurs.  Abdomen: Bowel sounds present, Non tender and  not distended with no gaurding, rigidity or rebound. Extremities: B/L Lower Ext shows no edema, both legs are warm to touch Neurology: Awake alert, and oriented X 3, CN II-XII intact, Non focal Skin:No Rash  Data Review Lab Results  Component Value Date   HGBA1C 5.80 02/15/2014   HGBA1C 5.4 02/06/2013     Assessment & Plan   1. Hypothyroidism, unspecified hypothyroidism type  - levothyroxine (SYNTHROID, LEVOTHROID) 50 MCG tablet; Take 1 tablet (50 mcg total) by mouth daily.  Dispense: 90 tablet; Refill: 3  2. Low back pain with sciatica, sciatica  laterality unspecified, unspecified back pain laterality  - naproxen (NAPROSYN) 500 MG tablet; Take 1 tablet (500 mg total) by mouth 2 (two) times daily with a meal.  Dispense: 60 tablet; Refill: 0  3. Right hip pain  - naproxen (NAPROSYN) 500 MG tablet; Take 1 tablet (500 mg total) by mouth 2 (two) times daily with a meal.  Dispense: 60 tablet; Refill: 0 - DG Arthro Hip Right; Future  Patient have been counseled extensively about nutrition and exercise Return in about 6 months (around 12/28/2014) for Routine Follow Up.  The patient was given clear instructions to go to ER or return to medical center if symptoms don't improve, worsen or new problems develop. The patient verbalized understanding. The patient was told to call to get lab results if they haven't heard anything in the next week.   This note has been created with Surveyor, quantity. Any transcriptional errors are unintentional.    Angelica Chessman, MD, [place], [place], [place], [place] and [place], [place]   06/28/2014, 2:47 PM

## 2014-06-28 NOTE — Patient Instructions (Signed)
Back Pain, Adult Low back pain is very common. About 1 in 5 people have back pain.The cause of low back pain is rarely dangerous. The pain often gets better over time.About half of people with a sudden onset of back pain feel better in just 2 weeks. About 8 in 10 people feel better by 6 weeks.  CAUSES Some common causes of back pain include:  Strain of the muscles or ligaments supporting the spine.  Wear and tear (degeneration) of the spinal discs.  Arthritis.  Direct injury to the back. DIAGNOSIS Most of the time, the direct cause of low back pain is not known.However, back pain can be treated effectively even when the exact cause of the pain is unknown.Answering your caregiver's questions about your overall health and symptoms is one of the most accurate ways to make sure the cause of your pain is not dangerous. If your caregiver needs more information, he or she may order lab work or imaging tests (X-rays or MRIs).However, even if imaging tests show changes in your back, this usually does not require surgery. HOME CARE INSTRUCTIONS For many people, back pain returns.Since low back pain is rarely dangerous, it is often a condition that people can learn to manageon their own.   Remain active. It is stressful on the back to sit or stand in one place. Do not sit, drive, or stand in one place for more than 30 minutes at a time. Take short walks on level surfaces as soon as pain allows.Try to increase the length of time you walk each day.  Do not stay in bed.Resting more than 1 or 2 days can delay your recovery.  Do not avoid exercise or work.Your body is made to move.It is not dangerous to be active, even though your back may hurt.Your back will likely heal faster if you return to being active before your pain is gone.  Pay attention to your body when you bend and lift. Many people have less discomfortwhen lifting if they bend their knees, keep the load close to their bodies,and  avoid twisting. Often, the most comfortable positions are those that put less stress on your recovering back.  Find a comfortable position to sleep. Use a firm mattress and lie on your side with your knees slightly bent. If you lie on your back, put a pillow under your knees.  Only take over-the-counter or prescription medicines as directed by your caregiver. Over-the-counter medicines to reduce pain and inflammation are often the most helpful.Your caregiver may prescribe muscle relaxant drugs.These medicines help dull your pain so you can more quickly return to your normal activities and healthy exercise.  Put ice on the injured area.  Put ice in a plastic bag.  Place a towel between your skin and the bag.  Leave the ice on for 15-20 minutes, 03-04 times a day for the first 2 to 3 days. After that, ice and heat may be alternated to reduce pain and spasms.  Ask your caregiver about trying back exercises and gentle massage. This may be of some benefit.  Avoid feeling anxious or stressed.Stress increases muscle tension and can worsen back pain.It is important to recognize when you are anxious or stressed and learn ways to manage it.Exercise is a great option. SEEK MEDICAL CARE IF:  You have pain that is not relieved with rest or medicine.  You have pain that does not improve in 1 week.  You have new symptoms.  You are generally not feeling well. SEEK   IMMEDIATE MEDICAL CARE IF:   You have pain that radiates from your back into your legs.  You develop new bowel or bladder control problems.  You have unusual weakness or numbness in your arms or legs.  You develop nausea or vomiting.  You develop abdominal pain.  You feel faint. Document Released: 12/22/2004 Document Revised: 06/23/2011 Document Reviewed: 04/25/2013 ExitCare Patient Information 2015 [employer]. This information is not intended to replace advice given to you by your health care provider. Make sure you  discuss any questions you have with your health care provider.  

## 2014-06-28 NOTE — Progress Notes (Signed)
Patient here for follow up for pain in both legs. Patient reports pain today in both legs described as a burning pain. Pain rated at an 8, pain comes and goes. Patient reports the pain has been occurring for a year now. Patient reports that she feels dizzy sometimes and it has been going on for a while.   Interpreter, Jori Moll, present with patient from [employer].

## 2014-08-21 ENCOUNTER — Ambulatory Visit: Payer: Self-pay | Attending: Internal Medicine

## 2016-06-29 ENCOUNTER — Emergency Department (HOSPITAL_COMMUNITY)
Admission: EM | Admit: 2016-06-29 | Discharge: 2016-06-29 | Disposition: A | Discharge status: Home / Self Care | Payer: BLUE CROSS/BLUE SHIELD | Attending: Emergency Medicine | Admitting: Emergency Medicine

## 2016-06-29 ENCOUNTER — Emergency Department (HOSPITAL_COMMUNITY): Payer: BLUE CROSS/BLUE SHIELD

## 2016-06-29 ENCOUNTER — Encounter (HOSPITAL_COMMUNITY): Payer: Self-pay

## 2016-06-29 DIAGNOSIS — E039 Hypothyroidism, unspecified: Secondary | ICD-10-CM | POA: Insufficient documentation

## 2016-06-29 DIAGNOSIS — R05 Cough: Secondary | ICD-10-CM | POA: Insufficient documentation

## 2016-06-29 DIAGNOSIS — R059 Cough, unspecified: Secondary | ICD-10-CM

## 2016-06-29 MED ORDER — CETIRIZINE HCL 10 MG PO TABS: 10.0000 mg | ORAL_TABLET | Freq: Every day | ORAL | 0 refills | Status: AC

## 2016-06-29 MED ORDER — ALBUTEROL SULFATE HFA 108 (90 BASE) MCG/ACT IN AERS: 2.0000 | INHALATION_SPRAY | RESPIRATORY_TRACT | 0 refills | Status: AC | PRN

## 2016-06-29 MED ORDER — FLUTICASONE PROPIONATE 50 MCG/ACT NA SUSP: 2.0000 | Freq: Every day | NASAL | 2 refills | Status: AC

## 2016-06-29 NOTE — ED Notes (Signed)
Patient called x 3 in all areas of waiting room with no response.

## 2016-06-29 NOTE — ED Provider Notes (Signed)
[facility] DEPT Provider Note   CSN: [id number] Arrival date & time: 06/29/16  1603  By signing my name below, I, Kari Ewing, attest that this documentation has been prepared under the direction and in the presence of Kari Sennett, PA-C. Electronically Signed: Dora Ewing, Scribe. 06/29/2016. 7:05 PM.  History   Chief Complaint Chief Complaint  Patient presents with  . Cough   The history is provided by the patient and a relative. No language interpreter was used.    HPI Comments: Kari Ewing is a 66 y.o. female who presents to the Emergency Department complaining of a persistent cough that is occasionally productive of yellow sputum for one week. She had nasal congestion, sore throat, and chest discomfort with cough, but these have resolved.  She was evaluated at an Urgent Care on 6/18 and was prescribed Tessalon 200mg , azithromycin, and a six-day prednisone taper which she has now completed. Patient has two remaining doses of the azithromycin. She did not have imaging of her chest taken at Urgent Care. She is a non-smoker. No h/o lung diseases. She notes that she has these symptoms around this time every year. Patient denies fevers, chills, N/V/D, shortness of breath, or any other associated symptoms.   Past Medical History:  Diagnosis Date  . 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 Thyroid activity decreased 06/28/2014  . Lumbago 06/28/2014  . Right hip pain 06/28/2014  . Frequent headaches 02/15/2014  . Headache(784.0) 02/06/2013  . Dizziness and giddiness 02/06/2013  . Unspecified hypothyroidism 11/10/2012  . Blurry vision, bilateral 11/10/2012  . Tearing 11/10/2012  . Unspecified constipation 10/05/2012  . Ear pain 06/22/2012  . 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 EYE  SURGERY     20 Years Ago.  Marland Kitchen MASS EXCISION Left [phone]    OB History    No data available       Home Medications    Prior to Admission medications   Medication Sig Start Date End Date Taking? Authorizing Provider  acetaminophen (TYLENOL) 325 MG tablet Take 650 mg by mouth every 6 (six) hours as needed for pain.    [provider]  albuterol (PROVENTIL HFA;VENTOLIN HFA) 108 (90 Base) MCG/ACT inhaler Inhale 2 puffs into the lungs every 4 (four) hours as needed for wheezing or shortness of breath. 06/29/16   Kristain Hu C, PA-C  antipyrine-benzocaine (AURALGAN) otic solution Place 3 drops into the right ear 4 (four) times daily as needed for pain. Patient not taking: Reported on 01/31/2014 10/05/12   Mendel Corning, MD  cetirizine (ZYRTEC ALLERGY) 10 MG tablet Take 1 tablet (10 mg total) by mouth daily. 06/29/16   Jasmain Ahlberg C, PA-C  clotrimazole (LOTRIMIN) 1 % cream Apply twice daily for 4 weeks Patient not taking: Reported on 01/31/2014 06/14/12   Doran Heater, MD  fluticasone [facility]) 50 MCG/ACT nasal spray Place 2 sprays into both nostrils daily. 06/29/16   Tahirah Sara C, PA-C  hydrocortisone (ANUSOL-HC) 25 MG suppository Place 1 suppository (25 mg total) rectally 2 (two) times daily. Patient not taking: Reported on 01/31/2014 10/05/12   Rai, Vernelle Emerald, MD  levothyroxine (SYNTHROID, LEVOTHROID) 50 MCG tablet Take 1 tablet (50 mcg total) by mouth daily. 06/28/14   Tresa Garter, MD  naproxen (NAPROSYN) 500 MG tablet Take 1 tablet (500 mg total)  by mouth 2 (two) times daily with a meal. 06/28/14   Jegede, Olugbemiga E, MD  polyethylene glycol powder (GLYCOLAX/MIRALAX) powder Take 17 g by mouth daily as needed (if no BM in 2 days). Patient not taking: Reported on 01/31/2014 10/05/12   Rai, Vernelle Emerald, MD  senna-docusate (SENOKOT S) 8.6-50 MG per tablet Take 2 tablets by mouth at bedtime. Patient not taking: Reported on 01/31/2014 10/05/12   Rai, Vernelle Emerald, MD  Wheat Dextrin  (BENEFIBER) POWD Take 1 scoop by mouth daily. Also available over the counter Patient not taking: Reported on 01/31/2014 10/05/12   Mendel Corning, MD    Family History History reviewed. No pertinent family history.  Social History Social History  Substance Use Topics  . Smoking status: Never Smoker  . Smokeless tobacco: Never Used  . Alcohol use No     Allergies   Patient has no known allergies.   Review of Systems Review of Systems  Constitutional: Negative for chills and fever.  HENT: Positive for sinus pressure. Negative for congestion, rhinorrhea, sore throat (resolved) and trouble swallowing.   Respiratory: Positive for cough. Negative for shortness of breath.   Cardiovascular: Negative for chest pain (resolved).  Gastrointestinal: Negative for abdominal pain, diarrhea, nausea and vomiting.  Neurological: Negative for dizziness, light-headedness and headaches.  All other systems reviewed and are negative.  Physical Exam Updated Vital Signs BP (!) 138/57 (BP Location: Left Arm)   Pulse 81   Temp 98 F (36.7 C) (Oral)   Resp 16   SpO2 97%   Physical Exam  Constitutional: She appears well-developed and well-nourished. No distress.  HENT:  Head: Normocephalic and atraumatic.  Mouth/Throat: Oropharynx is clear and moist and mucous membranes are normal.  Tenderness over bilateral maxillary and frontal sinuses with no edema or erythema. Of note, patient had no complaint of this until I specifically asked about it.  Eyes: Conjunctivae and EOM are normal. Pupils are equal, round, and reactive to light.  Neck: Normal range of motion. Neck supple.  Cardiovascular: Normal rate, regular rhythm, normal heart sounds and intact distal pulses.   Pulmonary/Chest: Effort normal and breath sounds normal. No respiratory distress.  Dry cough noted during exam. Patient shows no increased work of breathing. Speaks in full sentences without difficulty.  Abdominal: Soft. There is no  tenderness. There is no guarding.  Musculoskeletal: She exhibits no edema.  Lymphadenopathy:    She has no cervical adenopathy.  Neurological: She is alert.  Skin: Skin is warm and dry. She is not diaphoretic.  Psychiatric: She has a normal mood and affect. Her behavior is normal.  Nursing note and vitals reviewed.  ED Treatments / Results  Labs (all labs ordered are listed, but only abnormal results are displayed) Labs Reviewed - No data to display  EKG  EKG Interpretation None       Radiology Dg Chest 2 View  Result Date: 06/29/2016 CLINICAL DATA:  Dry cough for the past week. EXAM: CHEST  2 VIEW COMPARISON:  None. FINDINGS: The heart size and mediastinal contours are within normal limits. Both lungs are clear. The visualized skeletal structures are unremarkable. IMPRESSION: Normal examination. Electronically Signed   By: Claudie Revering M.D.   On: 06/29/2016 18:11    Procedures Procedures (including critical care time)  DIAGNOSTIC STUDIES: Oxygen Saturation is 97% on RA, normal by my interpretation.    COORDINATION OF CARE: 7:03 PM Discussed treatment plan with pt at bedside and pt agreed to plan.  Medications Ordered  in ED Medications - No data to display   Initial Impression / Assessment and Plan / ED Course  I have reviewed the triage vital signs and the nursing notes.  Pertinent labs & imaging results that were available during my care of the patient were reviewed by me and considered in my medical decision making (see chart for details).     Patient presents with persistent dry cough as well as congestion. No noted ACE inhibitor use. No noted respiratory distress. Patient is nontoxic appearing, afebrile, not tachycardic, not tachypneic, not hypotensive, maintains SPO2 of 97% on room air, and is in no apparent distress. Patient has no signs of sepsis or other serious or life-threatening condition. Environmental changes seem to be a factor as this is an annual  occurrence. PCP follow-up recommended. Resources given. The patient was given instructions for home care as well as return precautions. Patient voices understanding of these instructions, accepts the plan, and is comfortable with discharge.   Vitals:   06/29/16 1730 06/29/16 1922  BP: (!) 138/57 119/71  Pulse: 81 81  Resp: 16 18  Temp: 98 F (36.7 C) 97.7 F (36.5 C)  TempSrc: Oral Oral  SpO2: 97% 97%     Final Clinical Impressions(s) / ED Diagnoses   Final diagnoses:  Cough    New Prescriptions Discharge Medication List as of 06/29/2016  7:10 PM    START taking these medications   Details  albuterol (PROVENTIL HFA;VENTOLIN HFA) 108 (90 Base) MCG/ACT inhaler Inhale 2 puffs into the lungs every 4 (four) hours as needed for wheezing or shortness of breath., Starting Mon 06/29/2016, Print    cetirizine (ZYRTEC ALLERGY) 10 MG tablet Take 1 tablet (10 mg total) by mouth daily., Starting Mon 06/29/2016, Print    fluticasone (FLONASE) 50 MCG/ACT nasal spray Place 2 sprays into both nostrils daily., Starting Mon 06/29/2016, Print       I personally performed the services described in this documentation, which was scribed in my presence. The recorded information has been reviewed and is accurate.   Lorayne Bender, PA-C 06/29/16 1937    Forde Dandy, MD 06/29/16 2216

## 2016-06-29 NOTE — ED Triage Notes (Signed)
Pt has had dry cough X1 week. She has been taking zithromax and benzonatate at home without improvement. Pt has dry cough present in triage. Reports back pain with coughing.

## 2016-06-29 NOTE — Discharge Instructions (Signed)
You have been seen today for a persistent cough with allergy symptoms. Use the albuterol as needed for chest tightness and minor shortness of breath. Use the Flonase nasal spray daily, as directed. Uses Zyrtec daily as well. Zyrtec is also available over-the-counter. It is very important that you secure a primary care provider for continued management of this issue. Return to the ED for worsening symptoms.

## 2016-10-26 ENCOUNTER — Other Ambulatory Visit (HOSPITAL_COMMUNITY): Payer: Self-pay | Admitting: Internal Medicine

## 2016-10-26 DIAGNOSIS — Z1231 Encounter for screening mammogram for malignant neoplasm of breast: Secondary | ICD-10-CM

## 2016-10-30 ENCOUNTER — Encounter: Payer: Self-pay | Admitting: Obstetrics & Gynecology

## 2016-11-09 ENCOUNTER — Ambulatory Visit (HOSPITAL_COMMUNITY): Payer: BLUE CROSS/BLUE SHIELD

## 2016-12-01 ENCOUNTER — Other Ambulatory Visit: Payer: Self-pay | Admitting: Women's Health

## 2019-05-10 ENCOUNTER — Other Ambulatory Visit: Payer: Self-pay | Admitting: Internal Medicine

## 2019-05-10 DIAGNOSIS — Z1231 Encounter for screening mammogram for malignant neoplasm of breast: Secondary | ICD-10-CM

## 2019-05-24 ENCOUNTER — Other Ambulatory Visit: Payer: Self-pay

## 2019-05-24 ENCOUNTER — Ambulatory Visit
Admission: RE | Admit: 2019-05-24 | Discharge: 2019-05-24 | Disposition: A | Discharge status: Home / Self Care | Payer: Medicare Other | Source: Ambulatory Visit | Attending: Internal Medicine | Admitting: Internal Medicine

## 2019-05-24 DIAGNOSIS — Z1231 Encounter for screening mammogram for malignant neoplasm of breast: Secondary | ICD-10-CM

## 2019-05-26 ENCOUNTER — Other Ambulatory Visit: Payer: Self-pay | Admitting: Internal Medicine

## 2019-05-26 DIAGNOSIS — R928 Other abnormal and inconclusive findings on diagnostic imaging of breast: Secondary | ICD-10-CM

## 2019-06-21 ENCOUNTER — Other Ambulatory Visit: Payer: Medicare Other

## 2019-07-12 ENCOUNTER — Ambulatory Visit
Admission: RE | Admit: 2019-07-12 | Discharge: 2019-07-12 | Disposition: A | Discharge status: Home / Self Care | Payer: Medicare Other | Source: Ambulatory Visit | Attending: Internal Medicine | Admitting: Internal Medicine

## 2019-07-12 ENCOUNTER — Other Ambulatory Visit: Payer: Self-pay

## 2019-07-12 DIAGNOSIS — R928 Other abnormal and inconclusive findings on diagnostic imaging of breast: Secondary | ICD-10-CM

## 2019-08-22 ENCOUNTER — Other Ambulatory Visit: Payer: Medicare Other

## 2019-08-22 ENCOUNTER — Other Ambulatory Visit: Payer: Self-pay

## 2019-08-22 DIAGNOSIS — Z20822 Contact with and (suspected) exposure to covid-19: Secondary | ICD-10-CM

## 2019-08-23 LAB — SARS-COV-2, NAA 2 DAY TAT

## 2019-08-23 LAB — NOVEL CORONAVIRUS, NAA: SARS-CoV-2, NAA: NOT DETECTED

## 2020-06-10 ENCOUNTER — Other Ambulatory Visit: Payer: Self-pay | Admitting: Internal Medicine

## 2020-06-10 DIAGNOSIS — Z1231 Encounter for screening mammogram for malignant neoplasm of breast: Secondary | ICD-10-CM

## 2020-08-02 ENCOUNTER — Ambulatory Visit
Admission: RE | Admit: 2020-08-02 | Discharge: 2020-08-02 | Disposition: A | Discharge status: Home / Self Care | Payer: Medicare Other | Source: Ambulatory Visit | Attending: Internal Medicine | Admitting: Internal Medicine

## 2020-08-02 ENCOUNTER — Other Ambulatory Visit: Payer: Self-pay

## 2020-08-02 DIAGNOSIS — Z1231 Encounter for screening mammogram for malignant neoplasm of breast: Secondary | ICD-10-CM

## 2020-10-15 DIAGNOSIS — K5900 Constipation, unspecified: Secondary | ICD-10-CM | POA: Diagnosis not present

## 2020-10-15 DIAGNOSIS — E7800 Pure hypercholesterolemia, unspecified: Secondary | ICD-10-CM | POA: Diagnosis not present

## 2020-10-15 DIAGNOSIS — K219 Gastro-esophageal reflux disease without esophagitis: Secondary | ICD-10-CM | POA: Diagnosis not present

## 2020-10-15 DIAGNOSIS — G629 Polyneuropathy, unspecified: Secondary | ICD-10-CM | POA: Diagnosis not present

## 2021-04-07 ENCOUNTER — Other Ambulatory Visit (HOSPITAL_COMMUNITY): Payer: Self-pay | Admitting: Gerontology

## 2021-04-07 ENCOUNTER — Ambulatory Visit (HOSPITAL_COMMUNITY)
Admission: RE | Admit: 2021-04-07 | Discharge: 2021-04-07 | Disposition: A | Discharge status: Home / Self Care | Payer: Medicare HMO | Source: Ambulatory Visit | Attending: Gerontology | Admitting: Gerontology

## 2021-04-07 DIAGNOSIS — K219 Gastro-esophageal reflux disease without esophagitis: Secondary | ICD-10-CM | POA: Diagnosis not present

## 2021-04-07 DIAGNOSIS — M545 Low back pain, unspecified: Secondary | ICD-10-CM

## 2021-04-07 DIAGNOSIS — H9192 Unspecified hearing loss, left ear: Secondary | ICD-10-CM | POA: Diagnosis not present

## 2021-04-07 DIAGNOSIS — M5451 Vertebrogenic low back pain: Secondary | ICD-10-CM | POA: Diagnosis not present

## 2021-04-07 DIAGNOSIS — M5136 Other intervertebral disc degeneration, lumbar region: Secondary | ICD-10-CM | POA: Diagnosis not present

## 2021-04-07 DIAGNOSIS — G629 Polyneuropathy, unspecified: Secondary | ICD-10-CM | POA: Diagnosis not present

## 2021-04-07 DIAGNOSIS — M4317 Spondylolisthesis, lumbosacral region: Secondary | ICD-10-CM | POA: Diagnosis not present

## 2021-04-07 DIAGNOSIS — M4316 Spondylolisthesis, lumbar region: Secondary | ICD-10-CM | POA: Diagnosis not present

## 2021-06-09 DIAGNOSIS — M5416 Radiculopathy, lumbar region: Secondary | ICD-10-CM | POA: Diagnosis not present

## 2021-06-09 DIAGNOSIS — M5451 Vertebrogenic low back pain: Secondary | ICD-10-CM | POA: Diagnosis not present

## 2021-06-10 ENCOUNTER — Other Ambulatory Visit: Payer: Self-pay | Admitting: Orthopedic Surgery

## 2021-06-10 DIAGNOSIS — M545 Low back pain, unspecified: Secondary | ICD-10-CM

## 2021-06-25 DIAGNOSIS — Z1389 Encounter for screening for other disorder: Secondary | ICD-10-CM | POA: Diagnosis not present

## 2021-06-25 DIAGNOSIS — K219 Gastro-esophageal reflux disease without esophagitis: Secondary | ICD-10-CM | POA: Diagnosis not present

## 2021-06-25 DIAGNOSIS — H02003 Unspecified entropion of right eye, unspecified eyelid: Secondary | ICD-10-CM | POA: Diagnosis not present

## 2021-06-25 DIAGNOSIS — M4807 Spinal stenosis, lumbosacral region: Secondary | ICD-10-CM | POA: Diagnosis not present

## 2021-06-25 DIAGNOSIS — Z23 Encounter for immunization: Secondary | ICD-10-CM | POA: Diagnosis not present

## 2021-06-25 DIAGNOSIS — G629 Polyneuropathy, unspecified: Secondary | ICD-10-CM | POA: Diagnosis not present

## 2021-06-25 DIAGNOSIS — E7800 Pure hypercholesterolemia, unspecified: Secondary | ICD-10-CM | POA: Diagnosis not present

## 2021-06-25 DIAGNOSIS — Z0001 Encounter for general adult medical examination with abnormal findings: Secondary | ICD-10-CM | POA: Diagnosis not present

## 2021-06-25 DIAGNOSIS — Z1331 Encounter for screening for depression: Secondary | ICD-10-CM | POA: Diagnosis not present

## 2021-06-25 DIAGNOSIS — Z1159 Encounter for screening for other viral diseases: Secondary | ICD-10-CM | POA: Diagnosis not present

## 2021-06-26 ENCOUNTER — Ambulatory Visit
Admission: RE | Admit: 2021-06-26 | Discharge: 2021-06-26 | Disposition: A | Discharge status: Home / Self Care | Payer: Medicare HMO | Source: Ambulatory Visit | Attending: Orthopedic Surgery | Admitting: Orthopedic Surgery

## 2021-06-26 DIAGNOSIS — M545 Low back pain, unspecified: Secondary | ICD-10-CM | POA: Diagnosis not present

## 2021-07-03 ENCOUNTER — Encounter: Payer: Self-pay | Admitting: *Deleted

## 2021-07-21 DIAGNOSIS — M47816 Spondylosis without myelopathy or radiculopathy, lumbar region: Secondary | ICD-10-CM | POA: Diagnosis not present

## 2021-10-15 DIAGNOSIS — Z23 Encounter for immunization: Secondary | ICD-10-CM | POA: Diagnosis not present

## 2021-10-15 DIAGNOSIS — H8110 Benign paroxysmal vertigo, unspecified ear: Secondary | ICD-10-CM | POA: Diagnosis not present

## 2021-10-15 DIAGNOSIS — K219 Gastro-esophageal reflux disease without esophagitis: Secondary | ICD-10-CM | POA: Diagnosis not present

## 2021-10-15 DIAGNOSIS — G629 Polyneuropathy, unspecified: Secondary | ICD-10-CM | POA: Diagnosis not present

## 2021-10-16 DIAGNOSIS — E7800 Pure hypercholesterolemia, unspecified: Secondary | ICD-10-CM | POA: Diagnosis not present

## 2021-10-16 DIAGNOSIS — Z79899 Other long term (current) drug therapy: Secondary | ICD-10-CM | POA: Diagnosis not present

## 2021-10-16 DIAGNOSIS — K219 Gastro-esophageal reflux disease without esophagitis: Secondary | ICD-10-CM | POA: Diagnosis not present

## 2021-10-20 ENCOUNTER — Other Ambulatory Visit (HOSPITAL_COMMUNITY): Payer: Self-pay | Admitting: Gerontology

## 2021-10-20 ENCOUNTER — Other Ambulatory Visit: Payer: Self-pay | Admitting: Gerontology

## 2021-10-20 DIAGNOSIS — R42 Dizziness and giddiness: Secondary | ICD-10-CM

## 2021-10-27 ENCOUNTER — Encounter (HOSPITAL_COMMUNITY): Payer: Self-pay

## 2021-10-27 ENCOUNTER — Ambulatory Visit (HOSPITAL_COMMUNITY): Payer: Medicare HMO

## 2021-10-27 ENCOUNTER — Ambulatory Visit (HOSPITAL_COMMUNITY)
Admission: RE | Admit: 2021-10-27 | Discharge: 2021-10-27 | Disposition: A | Discharge status: Home / Self Care | Payer: Medicare HMO | Source: Ambulatory Visit | Attending: Gerontology | Admitting: Gerontology

## 2021-10-27 DIAGNOSIS — R42 Dizziness and giddiness: Secondary | ICD-10-CM | POA: Diagnosis not present

## 2021-12-08 ENCOUNTER — Encounter: Payer: Self-pay | Admitting: *Deleted

## 2022-01-12 DIAGNOSIS — K219 Gastro-esophageal reflux disease without esophagitis: Secondary | ICD-10-CM | POA: Diagnosis not present

## 2022-01-12 DIAGNOSIS — G629 Polyneuropathy, unspecified: Secondary | ICD-10-CM | POA: Diagnosis not present

## 2022-01-12 DIAGNOSIS — M4807 Spinal stenosis, lumbosacral region: Secondary | ICD-10-CM | POA: Diagnosis not present

## 2022-01-12 DIAGNOSIS — H9313 Tinnitus, bilateral: Secondary | ICD-10-CM | POA: Diagnosis not present

## 2022-01-12 DIAGNOSIS — H8110 Benign paroxysmal vertigo, unspecified ear: Secondary | ICD-10-CM | POA: Diagnosis not present

## 2022-04-15 DIAGNOSIS — D487 Neoplasm of uncertain behavior of other specified sites: Secondary | ICD-10-CM | POA: Diagnosis not present

## 2022-04-15 DIAGNOSIS — H02831 Dermatochalasis of right upper eyelid: Secondary | ICD-10-CM | POA: Diagnosis not present

## 2022-04-15 DIAGNOSIS — H02056 Trichiasis without entropian left eye, unspecified eyelid: Secondary | ICD-10-CM | POA: Diagnosis not present

## 2022-04-15 DIAGNOSIS — H02053 Trichiasis without entropian right eye, unspecified eyelid: Secondary | ICD-10-CM | POA: Diagnosis not present

## 2022-04-15 DIAGNOSIS — H11042 Peripheral pterygium, stationary, left eye: Secondary | ICD-10-CM | POA: Diagnosis not present

## 2022-04-15 DIAGNOSIS — H02834 Dermatochalasis of left upper eyelid: Secondary | ICD-10-CM | POA: Diagnosis not present

## 2022-06-22 DIAGNOSIS — H02834 Dermatochalasis of left upper eyelid: Secondary | ICD-10-CM | POA: Diagnosis not present

## 2022-06-22 DIAGNOSIS — H02831 Dermatochalasis of right upper eyelid: Secondary | ICD-10-CM | POA: Diagnosis not present

## 2022-06-22 DIAGNOSIS — H02053 Trichiasis without entropian right eye, unspecified eyelid: Secondary | ICD-10-CM | POA: Diagnosis not present

## 2022-06-22 DIAGNOSIS — H11042 Peripheral pterygium, stationary, left eye: Secondary | ICD-10-CM | POA: Diagnosis not present

## 2022-06-22 DIAGNOSIS — H2513 Age-related nuclear cataract, bilateral: Secondary | ICD-10-CM | POA: Diagnosis not present

## 2022-06-22 DIAGNOSIS — H02056 Trichiasis without entropian left eye, unspecified eyelid: Secondary | ICD-10-CM | POA: Diagnosis not present

## 2022-06-22 DIAGNOSIS — H179 Unspecified corneal scar and opacity: Secondary | ICD-10-CM | POA: Diagnosis not present

## 2022-07-06 ENCOUNTER — Other Ambulatory Visit (HOSPITAL_COMMUNITY): Payer: Self-pay | Admitting: Gerontology

## 2022-07-06 ENCOUNTER — Other Ambulatory Visit: Payer: Self-pay

## 2022-07-06 ENCOUNTER — Ambulatory Visit (HOSPITAL_COMMUNITY)
Admission: RE | Admit: 2022-07-06 | Discharge: 2022-07-06 | Disposition: A | Discharge status: Home / Self Care | Payer: Medicare HMO | Source: Ambulatory Visit | Attending: Gerontology | Admitting: Gerontology

## 2022-07-06 DIAGNOSIS — H9313 Tinnitus, bilateral: Secondary | ICD-10-CM | POA: Diagnosis not present

## 2022-07-06 DIAGNOSIS — M810 Age-related osteoporosis without current pathological fracture: Secondary | ICD-10-CM

## 2022-07-06 DIAGNOSIS — Z23 Encounter for immunization: Secondary | ICD-10-CM | POA: Diagnosis not present

## 2022-07-06 DIAGNOSIS — Z1331 Encounter for screening for depression: Secondary | ICD-10-CM | POA: Diagnosis not present

## 2022-07-06 DIAGNOSIS — E7800 Pure hypercholesterolemia, unspecified: Secondary | ICD-10-CM | POA: Diagnosis not present

## 2022-07-06 DIAGNOSIS — G629 Polyneuropathy, unspecified: Secondary | ICD-10-CM | POA: Diagnosis not present

## 2022-07-06 DIAGNOSIS — R059 Cough, unspecified: Secondary | ICD-10-CM

## 2022-07-06 DIAGNOSIS — J40 Bronchitis, not specified as acute or chronic: Secondary | ICD-10-CM | POA: Diagnosis not present

## 2022-07-06 DIAGNOSIS — I6523 Occlusion and stenosis of bilateral carotid arteries: Secondary | ICD-10-CM | POA: Diagnosis not present

## 2022-07-06 DIAGNOSIS — M4807 Spinal stenosis, lumbosacral region: Secondary | ICD-10-CM | POA: Diagnosis not present

## 2022-07-06 DIAGNOSIS — Z0001 Encounter for general adult medical examination with abnormal findings: Secondary | ICD-10-CM | POA: Diagnosis not present

## 2022-07-06 DIAGNOSIS — Z1389 Encounter for screening for other disorder: Secondary | ICD-10-CM | POA: Diagnosis not present

## 2022-07-06 DIAGNOSIS — K219 Gastro-esophageal reflux disease without esophagitis: Secondary | ICD-10-CM | POA: Diagnosis not present

## 2022-07-07 ENCOUNTER — Encounter (INDEPENDENT_AMBULATORY_CARE_PROVIDER_SITE_OTHER): Payer: Self-pay | Admitting: *Deleted

## 2022-07-28 ENCOUNTER — Ambulatory Visit (HOSPITAL_COMMUNITY)
Admission: RE | Admit: 2022-07-28 | Discharge: 2022-07-28 | Disposition: A | Discharge status: Home / Self Care | Payer: Medicare HMO | Source: Ambulatory Visit | Attending: Gerontology | Admitting: Gerontology

## 2022-07-28 DIAGNOSIS — M810 Age-related osteoporosis without current pathological fracture: Secondary | ICD-10-CM | POA: Insufficient documentation

## 2022-07-28 DIAGNOSIS — M8588 Other specified disorders of bone density and structure, other site: Secondary | ICD-10-CM | POA: Diagnosis not present

## 2022-08-07 ENCOUNTER — Telehealth (INDEPENDENT_AMBULATORY_CARE_PROVIDER_SITE_OTHER): Payer: Self-pay | Admitting: Gastroenterology

## 2022-08-07 NOTE — Telephone Encounter (Signed)
Who is your primary care physician: Dr.Fanta  Reasons for the colonoscopy: Screening   Have you had a colonoscopy before?  no  Do you have family history of colon cancer? no  Previous colonoscopy with polyps removed? no  Do you have a history colorectal cancer?   no  Are you diabetic? If yes, Type 1 or Type 2?    no  Do you have a prosthetic or mechanical heart valve? no  Do you have a pacemaker/defibrillator?   no  Have you had endocarditis/atrial fibrillation? no  Have you had joint replacement within the last 12 months?  no  Do you tend to be constipated or have to use laxatives? no  Do you have any history of drugs or alchohol?  no  Do you use supplemental oxygen?  no  Have you had a stroke or heart attack within the last 6 months? no  Do you take weight loss medication?  no  For female patients: have you had a hysterectomy?  no                                     are you post menopausal?      yes                                    do you still have your menstrual cycle? no      Do you take any blood-thinning medications such as: (aspirin, warfarin, Plavix, Aggrenox)  no  If yes we need the name, milligram, dosage and who is prescribing doctor  Current Outpatient Medications on File Prior to Visit  Medication Sig Dispense Refill   docusate sodium (COLACE) 250 MG capsule Take 250 mg by mouth as needed for constipation.     gabapentin (NEURONTIN) 100 MG capsule Take 100 mg by mouth 3 (three) times daily.     omeprazole (PRILOSEC) 20 MG capsule Take 20 mg by mouth daily.     senna-docusate (SENOKOT S) 8.6-50 MG per tablet Take 2 tablets by mouth at bedtime. 60 tablet 3   acetaminophen (TYLENOL) 325 MG tablet Take 650 mg by mouth every 6 (six) hours as needed for pain. (Patient not taking: Reported on 08/07/2022)     albuterol (PROVENTIL HFA;VENTOLIN HFA) 108 (90 Base) MCG/ACT inhaler Inhale 2 puffs into the lungs every 4 (four) hours as needed for wheezing or shortness of  breath. (Patient not taking: Reported on 08/07/2022) 1 Inhaler 0   antipyrine-benzocaine (AURALGAN) otic solution Place 3 drops into the right ear 4 (four) times daily as needed for pain. (Patient not taking: Reported on 08/07/2022) 10 mL 1   cetirizine (ZYRTEC ALLERGY) 10 MG tablet Take 1 tablet (10 mg total) by mouth daily. (Patient not taking: Reported on 08/07/2022) 30 tablet 0   clotrimazole (LOTRIMIN) 1 % cream Apply twice daily for 4 weeks (Patient not taking: Reported on 08/07/2022) 60 g 0   fluticasone (FLONASE) 50 MCG/ACT nasal spray Place 2 sprays into both nostrils daily. (Patient not taking: Reported on 08/07/2022) 16 g 2   hydrocortisone (ANUSOL-HC) 25 MG suppository Place 1 suppository (25 mg total) rectally 2 (two) times daily. (Patient not taking: Reported on 08/07/2022) 60 suppository 0   levothyroxine (SYNTHROID, LEVOTHROID) 50 MCG tablet Take 1 tablet (50 mcg total) by mouth daily. (Patient not taking: Reported on 08/07/2022)  90 tablet 3   naproxen (NAPROSYN) 500 MG tablet Take 1 tablet (500 mg total) by mouth 2 (two) times daily with a meal. (Patient not taking: Reported on 08/07/2022) 60 tablet 0   polyethylene glycol powder (GLYCOLAX/MIRALAX) powder Take 17 g by mouth daily as needed (if no BM in 2 days). (Patient not taking: Reported on 08/07/2022) 3350 g 1   Wheat Dextrin (BENEFIBER) POWD Take 1 scoop by mouth daily. Also available over the counter (Patient not taking: Reported on 08/07/2022) 350 g 1   No current facility-administered medications on file prior to visit.    No Known Allergies   Pharmacy: [facility]  Primary Insurance Name: [facility]  Best number where you can be reached:

## 2022-08-10 NOTE — Telephone Encounter (Signed)
Left message to return call 

## 2022-08-17 ENCOUNTER — Telehealth (INDEPENDENT_AMBULATORY_CARE_PROVIDER_SITE_OTHER): Payer: Self-pay | Admitting: Gastroenterology

## 2022-08-17 NOTE — Telephone Encounter (Signed)
Patient returned your call to schedule colonoscopy.  

## 2022-08-19 MED ORDER — PEG 3350-KCL-NA BICARB-NACL 420 G PO SOLR: 4000.0000 mL | Freq: Once | ORAL | 0 refills | Status: AC

## 2022-08-19 NOTE — Telephone Encounter (Signed)
Pt contacted. Scheduled for 08/31/22 at 1:45pm (pt requesting late afternoon). Instructions mailed to pt. Prep sent to [facility] per questionnaire. No PA per [employer]

## 2022-08-31 ENCOUNTER — Ambulatory Visit (HOSPITAL_COMMUNITY)
Admission: RE | Admit: 2022-08-31 | Discharge: 2022-08-31 | Disposition: A | Discharge status: Home / Self Care | Payer: Medicare HMO | Attending: Gastroenterology | Admitting: Gastroenterology

## 2022-08-31 ENCOUNTER — Ambulatory Visit (HOSPITAL_BASED_OUTPATIENT_CLINIC_OR_DEPARTMENT_OTHER): Payer: Medicare HMO | Admitting: Anesthesiology

## 2022-08-31 ENCOUNTER — Ambulatory Visit (HOSPITAL_COMMUNITY): Payer: Medicare HMO | Admitting: Anesthesiology

## 2022-08-31 ENCOUNTER — Encounter (INDEPENDENT_AMBULATORY_CARE_PROVIDER_SITE_OTHER): Payer: Self-pay | Admitting: *Deleted

## 2022-08-31 ENCOUNTER — Encounter (HOSPITAL_COMMUNITY)
Admission: RE | Disposition: A | Discharge status: Home / Self Care | Payer: Self-pay | Source: Home / Self Care | Attending: Gastroenterology

## 2022-08-31 ENCOUNTER — Encounter (HOSPITAL_COMMUNITY): Payer: Self-pay

## 2022-08-31 ENCOUNTER — Other Ambulatory Visit: Payer: Self-pay

## 2022-08-31 DIAGNOSIS — K635 Polyp of colon: Secondary | ICD-10-CM | POA: Diagnosis not present

## 2022-08-31 DIAGNOSIS — K6389 Other specified diseases of intestine: Secondary | ICD-10-CM | POA: Diagnosis not present

## 2022-08-31 DIAGNOSIS — Z1211 Encounter for screening for malignant neoplasm of colon: Secondary | ICD-10-CM | POA: Insufficient documentation

## 2022-08-31 DIAGNOSIS — K648 Other hemorrhoids: Secondary | ICD-10-CM | POA: Insufficient documentation

## 2022-08-31 DIAGNOSIS — D122 Benign neoplasm of ascending colon: Secondary | ICD-10-CM

## 2022-08-31 DIAGNOSIS — Z139 Encounter for screening, unspecified: Secondary | ICD-10-CM | POA: Diagnosis not present

## 2022-08-31 DIAGNOSIS — D123 Benign neoplasm of transverse colon: Secondary | ICD-10-CM

## 2022-08-31 DIAGNOSIS — K644 Residual hemorrhoidal skin tags: Secondary | ICD-10-CM | POA: Insufficient documentation

## 2022-08-31 DIAGNOSIS — D121 Benign neoplasm of appendix: Secondary | ICD-10-CM | POA: Diagnosis not present

## 2022-08-31 DIAGNOSIS — E039 Hypothyroidism, unspecified: Secondary | ICD-10-CM | POA: Diagnosis not present

## 2022-08-31 HISTORY — PX: POLYPECTOMY: SHX5525

## 2022-08-31 HISTORY — PX: COLONOSCOPY WITH PROPOFOL: SHX5780

## 2022-08-31 LAB — HM COLONOSCOPY

## 2022-08-31 SURGERY — COLONOSCOPY WITH PROPOFOL
Anesthesia: General

## 2022-08-31 MED ORDER — PHENYLEPHRINE 80 MCG/ML (10ML) SYRINGE FOR IV PUSH (FOR BLOOD PRESSURE SUPPORT)
PREFILLED_SYRINGE | INTRAVENOUS | Status: AC
  Filled 2022-08-31: qty 10

## 2022-08-31 MED ORDER — PROPOFOL 500 MG/50ML IV EMUL
INTRAVENOUS | Status: DC | PRN
  Administered 2022-08-31: 150 ug/kg/min via INTRAVENOUS

## 2022-08-31 MED ORDER — PHENYLEPHRINE 80 MCG/ML (10ML) SYRINGE FOR IV PUSH (FOR BLOOD PRESSURE SUPPORT)
PREFILLED_SYRINGE | INTRAVENOUS | Status: DC | PRN
  Administered 2022-08-31 (×4): 160 ug via INTRAVENOUS

## 2022-08-31 MED ORDER — LIDOCAINE HCL (CARDIAC) PF 100 MG/5ML IV SOSY
PREFILLED_SYRINGE | INTRAVENOUS | Status: DC | PRN
  Administered 2022-08-31: 50 mg via INTRAVENOUS

## 2022-08-31 MED ORDER — LACTATED RINGERS IV SOLN: INTRAVENOUS | Status: DC

## 2022-08-31 MED ORDER — PROPOFOL 10 MG/ML IV BOLUS
INTRAVENOUS | Status: DC | PRN
  Administered 2022-08-31: 100 mg via INTRAVENOUS

## 2022-08-31 NOTE — Op Note (Signed)
[facility] Patient Name: Kari Ewing Procedure Date: 08/31/2022 1:19 PM MRN: [id number] Date of Birth: [birth year] Attending MD: Sanjuan Dame , MD, [id number] CSN: [id number] Age: 72 Admit Type: Outpatient Procedure:                Colonoscopy Indications:              Screening for colorectal malignant neoplasm Providers:                Sanjuan Dame, MD, Nena Polio, RN, Zena Amos Referring MD:              Medicines:                Monitored Anesthesia Care Complications:            No immediate complications. Estimated Blood Loss:     Estimated blood loss: none. Procedure:                Pre-Anesthesia Assessment:                           - Prior to the procedure, a History and Physical                            was performed, and patient medications and                            allergies were reviewed. The patient's tolerance of                            previous anesthesia was also reviewed. The risks                            and benefits of the procedure and the sedation                            options and risks were discussed with the patient.                            All questions were answered, and informed consent                            was obtained. Prior Anticoagulants: The patient has                            taken no anticoagulant or antiplatelet agents. ASA                            Grade Assessment: II - A patient with mild systemic                            disease. After reviewing the risks and benefits,                            the patient was deemed in satisfactory condition to  undergo the procedure.                           After obtaining informed consent, the colonoscope                            was passed under direct vision. Throughout the                            procedure, the patient's blood pressure, pulse, and                            oxygen saturations were monitored continuously.  The                            [id number]) scope was introduced through the                            anus and advanced to the the cecum, identified by                            appendiceal orifice and ileocecal valve. The                            colonoscopy was performed without difficulty. The                            patient tolerated the procedure well. The quality                            of the bowel preparation was evaluated using the                            BBPS [facility] Bowel Preparation Scale) with scores                            of: Right Colon = 3, Transverse Colon = 3 and Left                            Colon = 3 (entire mucosa seen well with no residual                            staining, small fragments of stool or opaque                            liquid). The total BBPS score equals 9. The                            ileocecal valve, appendiceal orifice, and rectum                            were photographed. Scope In: 1:41:43 PM Scope Out: 2:03:35 PM Scope Withdrawal Time: 0 hours 14 minutes 55 seconds  Total  Procedure Duration: 0 hours 21 minutes 52 seconds  Findings:      The perianal and digital rectal examinations were normal.      A 2 mm polyp was found in the appendiceal orifice. The polyp was       sessile. The polyp was removed with a cold biopsy forceps. Resection and       retrieval were complete.      A 5 mm polyp was found in the ascending colon. The polyp was sessile.       The polyp was removed with a cold snare. Resection and retrieval were       complete.      A 4 mm polyp was found in the transverse colon. The polyp was sessile.       The polyp was removed with a cold snare. Resection and retrieval were       complete.      Non-bleeding external and internal hemorrhoids were found during       retroflexion. The hemorrhoids were medium-sized. Impression:               - One 2 mm polyp at the appendiceal orifice,                             removed with a cold biopsy forceps. Resected and                            retrieved.                           - One 5 mm polyp in the ascending colon, removed                            with a cold snare. Resected and retrieved.                           - One 4 mm polyp in the transverse colon, removed                            with a cold snare. Resected and retrieved.                           - Non-bleeding external and internal hemorrhoids. Moderate Sedation:      Per Anesthesia Care Recommendation:           - Patient has a contact number available for                            emergencies. The signs and symptoms of potential                            delayed complications were discussed with the                            patient. Return to normal activities tomorrow.                            Written discharge instructions were  provided to the                            patient.                           - Resume previous diet.                           - Continue present medications.                           - Await pathology results.                           - No repeat colonoscopy due to current age (42                            years or older).                           - Return to primary care physician as previously                            scheduled. Procedure Code(s):        --- Professional ---                           [phone], Colonoscopy, flexible; with removal of                            tumor(s), polyp(s), or other lesion(s) by snare                            technique                           45380, 59, Colonoscopy, flexible; with biopsy,                            single or multiple Diagnosis Code(s):        --- Professional ---                           Z12.11, Encounter for screening for malignant                            neoplasm of colon                           D12.1, Benign neoplasm of appendix                           D12.2,  Benign neoplasm of ascending colon                           D12.3, Benign neoplasm of transverse colon (hepatic  flexure or splenic flexure)                           K64.8, Other hemorrhoids CPT copyright 2022 American Medical Association. All rights reserved. The codes documented in this report are preliminary and upon coder review may  be revised to meet current compliance requirements. Sanjuan Dame, MD Sanjuan Dame, MD 08/31/2022 2:08:39 PM This report has been signed electronically. Number of Addenda: 0

## 2022-08-31 NOTE — Discharge Instructions (Signed)

## 2022-08-31 NOTE — H&P (Signed)
Primary Care Physician:  Benetta Spar, MD Primary Gastroenterologist:  Dr. Tasia Catchings  Pre-Procedure History & Physical: HPI:  Kari Ewing is an Amharic speaking 72 y.o. female is here for a colonoscopy for colon cancer screening purposes.  Patient denies any family history of colorectal cancer.  Patient daughter Kari Ewing is bedside and assisted in explaining the procedure with [place] . No melena or hematochezia.  No abdominal pain or unintentional weight loss.  No change in bowel habits.  Overall feels well from a GI standpoint.  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   MASS EXCISION Left [id number]    Prior to Admission medications   Medication Sig Start Date End Date Taking? Authorizing Provider  omeprazole (PRILOSEC) 20 MG capsule Take 20 mg by mouth daily.   Yes [provider]  senna-docusate (SENOKOT S) 8.6-50 MG per tablet Take 2 tablets by mouth at bedtime. 10/05/12  Yes Rai, Ripudeep K, MD  acetaminophen (TYLENOL) 325 MG tablet Take 650 mg by mouth every 6 (six) hours as needed for pain. Patient not taking: Reported on 08/07/2022    [provider]  albuterol (PROVENTIL HFA;VENTOLIN HFA) 108 (90 Base) MCG/ACT inhaler Inhale 2 puffs into the lungs every 4 (four) hours as needed for wheezing or shortness of breath. Patient not taking: Reported on 08/07/2022 06/29/16   Anselm Pancoast, PA-C  antipyrine-benzocaine Lyla Son) otic solution Place 3 drops into the right ear 4 (four) times daily as needed for pain. Patient not taking: Reported on 08/07/2022 10/05/12   Cathren Harsh, MD  cetirizine (ZYRTEC ALLERGY) 10 MG tablet Take 1 tablet (10 mg total) by mouth daily. Patient not taking: Reported on 08/07/2022 06/29/16   Anselm Pancoast, PA-C  clotrimazole (LOTRIMIN) 1 % cream Apply  twice daily for 4 weeks Patient not taking: Reported on 08/07/2022 06/14/12   Acey Lav, MD  docusate sodium (COLACE) 250 MG capsule Take 250 mg by mouth as needed for constipation.    [provider]  fluticasone (FLONASE) 50 MCG/ACT nasal spray Place 2 sprays into both nostrils daily. Patient not taking: Reported on 08/07/2022 06/29/16   Harolyn Rutherford C, PA-C  gabapentin (NEURONTIN) 100 MG capsule Take 100 mg by mouth 3 (three) times daily. 07/13/22   [provider]  hydrocortisone (ANUSOL-HC) 25 MG suppository Place 1 suppository (25 mg total) rectally 2 (two) times daily. Patient not taking: Reported on 08/07/2022 10/05/12   Rai, Delene Ruffini, MD  levothyroxine (SYNTHROID, LEVOTHROID) 50 MCG tablet Take 1 tablet (50 mcg total) by mouth daily. Patient not taking: Reported on 08/07/2022 06/28/14   Quentin Angst, MD  naproxen (NAPROSYN) 500 MG tablet Take 1 tablet (500 mg total) by mouth 2 (two) times daily with a meal. Patient not taking: Reported on 08/07/2022 06/28/14   Quentin Angst, MD  polyethylene glycol powder (GLYCOLAX/MIRALAX) powder Take 17 g by mouth daily as needed (if no BM in 2 days). Patient not taking: Reported on 08/07/2022 10/05/12   Rai, Delene Ruffini, MD  Wheat Dextrin (BENEFIBER) POWD Take 1 scoop by mouth daily. Also available over the counter Patient not taking: Reported on 08/07/2022 10/05/12   Cathren Harsh, MD    Allergies as of 08/19/2022   (No Known Allergies)  No family history on file.  Social History   Socioeconomic History   Marital status: Married    Spouse name: Not on file   Number of children: Not on file   Years of education: Not on file   Highest education level: Not on file  Occupational History   Not on file  Tobacco Use   Smoking status: Never   Smokeless tobacco: Never  Substance and Sexual Activity   Alcohol use: No   Drug use: No   Sexual activity: Not on file  Other Topics Concern   Not on file  Social History  Narrative   Originally from [place].  Relocated to [facility] 2012   Social Determinants of Health   Financial Resource Strain: Not on file  Food Insecurity: Not on file  Transportation Needs: Not on file  Physical Activity: Not on file  Stress: Not on file  Social Connections: Not on file  Intimate Partner Violence: Not on file    Review of Systems: See HPI, otherwise negative ROS  Physical Exam: Vital signs in last 24 hours:     General:   Alert,  Well-developed, well-nourished, pleasant and cooperative in NAD Head:  Normocephalic and atraumatic. Eyes:  Sclera clear, no icterus.   Conjunctiva pink. Ears:  Normal auditory acuity. Nose:  No deformity, discharge,  or lesions. Msk:  Symmetrical without gross deformities. Normal posture. Extremities:  Without clubbing or edema. Neurologic:  Alert and  oriented x4;  grossly normal neurologically. Skin:  Intact without significant lesions or rashes. Psych:  Alert and cooperative. Normal mood and affect.  Impression/Plan: Kari Ewing is here for a colonoscopy to be performed for colon cancer screening purposes.  The risks of the procedure including infection, bleed, or perforation as well as benefits, limitations, alternatives and imponderables have been reviewed with the patient. Questions have been answered. All parties agreeable.

## 2022-08-31 NOTE — Anesthesia Procedure Notes (Signed)
Date/Time: 08/31/2022 1:37 PM  Performed by: Julian Reil, CRNAPre-anesthesia Checklist: Emergency Drugs available, Patient identified, Suction available and Patient being monitored Patient Re-evaluated:Patient Re-evaluated prior to induction Oxygen Delivery Method: Nasal cannula Induction Type: IV induction Placement Confirmation: positive ETCO2

## 2022-08-31 NOTE — Anesthesia Preprocedure Evaluation (Signed)
Anesthesia Evaluation  Patient identified by MRN, date of birth, ID band Patient awake    Reviewed: Allergy & Precautions, H&P , NPO status , Patient's Chart, lab work & pertinent test results, reviewed documented beta blocker date and time   Airway Mallampati: II  TM Distance: >3 FB Neck ROM: full    Dental no notable dental hx.    Pulmonary neg pulmonary ROS   Pulmonary exam normal breath sounds clear to auscultation       Cardiovascular Exercise Tolerance: Good negative cardio ROS  Rhythm:regular Rate:Normal     Neuro/Psych  Headaches negative neurological ROS  negative psych ROS   GI/Hepatic negative GI ROS, Neg liver ROS,,,  Endo/Other  negative endocrine ROSHypothyroidism    Renal/GU negative Renal ROS  negative genitourinary   Musculoskeletal   Abdominal   Peds  Hematology negative hematology ROS (+)   Anesthesia Other Findings   Reproductive/Obstetrics negative OB ROS                             Anesthesia Physical Anesthesia Plan  ASA: 2  Anesthesia Plan: General   Post-op Pain Management:    Induction:   PONV Risk Score and Plan: Propofol infusion  Airway Management Planned:   Additional Equipment:   Intra-op Plan:   Post-operative Plan:   Informed Consent: I have reviewed the patients History and Physical, chart, labs and discussed the procedure including the risks, benefits and alternatives for the proposed anesthesia with the patient or authorized representative who has indicated his/her understanding and acceptance.     Dental Advisory Given  Plan Discussed with: CRNA  Anesthesia Plan Comments:        Anesthesia Quick Evaluation

## 2022-08-31 NOTE — Transfer of Care (Signed)
Immediate Anesthesia Transfer of Care Note  Patient: Kari Ewing  Procedure(s) Performed: COLONOSCOPY WITH PROPOFOL POLYPECTOMY  Patient Location: Endoscopy Unit  Anesthesia Type:General  Level of Consciousness: drowsy  Airway & Oxygen Therapy: Patient Spontanous Breathing  Post-op Assessment: Report given to RN and Post -op Vital signs reviewed and stable  Post vital signs: Reviewed and stable  Last Vitals:  Vitals Value Taken Time  BP 91/45 08/31/22 1407  Temp 36.4 C 08/31/22 1407  Pulse 69 08/31/22 1407  Resp 17 08/31/22 1407  SpO2 96 % 08/31/22 1407    Last Pain:  Vitals:   08/31/22 1407  TempSrc: Oral  PainSc:       Patients Stated Pain Goal: 4 (08/31/22 1246)  Complications: No notable events documented.

## 2022-09-02 ENCOUNTER — Encounter (INDEPENDENT_AMBULATORY_CARE_PROVIDER_SITE_OTHER): Payer: Self-pay | Admitting: *Deleted

## 2022-09-02 LAB — SURGICAL PATHOLOGY

## 2022-09-02 NOTE — Progress Notes (Signed)
I reviewed the pathology results. Kari Ewing, can you send her a letter with the findings as described below please? Repeat colonoscopy in 7 years  Thanks,  Vista Lawman, MD Gastroenterology and Hepatology [facility]  ---------------------------------------------------------------------------------------------  [facility] [address], [address], [place], [place] [address] Phone:  [phone]   09/02/22 [place], [place]   Dear Kari Ewing,  I am writing to inform you that the biopsies taken during your recent endoscopic examination showed: Tubular Adenoma   I am writing to let you know the results of your recent colonoscopy.  You had a total of 2 polyps removed. The pathology came back as "tubular adenoma." These findings are NOT cancer, but had the polyps remained in your colon, they could have turned into cancer.  Given these findings, it is recommended that your next colonoscopy be performed in 7 years. But at that time you will be age 47 and to pursue further colonoscopy will be an individualized decision given your age and medical problems at that time    Please call us at [phone] if you have persistent problems or have questions about your condition that have not been fully answered at this time.  Sincerely,  Vista Lawman, MD Gastroenterology and Hepatology

## 2022-09-03 ENCOUNTER — Encounter (HOSPITAL_COMMUNITY): Payer: Self-pay | Admitting: Gastroenterology

## 2022-09-04 NOTE — Anesthesia Postprocedure Evaluation (Signed)
Anesthesia Post Note  Patient: Kari Ewing  Procedure(s) Performed: COLONOSCOPY WITH PROPOFOL POLYPECTOMY  Patient location during evaluation: Phase II Anesthesia Type: General Level of consciousness: awake Pain management: pain level controlled Vital Signs Assessment: post-procedure vital signs reviewed and stable Respiratory status: spontaneous breathing and respiratory function stable Cardiovascular status: blood pressure returned to baseline and stable Postop Assessment: no headache and no apparent nausea or vomiting Anesthetic complications: no Comments: Late entry   No notable events documented.   Last Vitals:  Vitals:   08/31/22 1407 08/31/22 1410  BP: (!) 91/45 105/81  Pulse: 69 64  Resp: 17 (!) 21  Temp: (!) 36.4 C   SpO2: 96% 96%    Last Pain:  Vitals:   08/31/22 1410  TempSrc:   PainSc: 0-No pain                 Windell Norfolk

## 2023-04-07 DIAGNOSIS — G629 Polyneuropathy, unspecified: Secondary | ICD-10-CM | POA: Diagnosis not present

## 2023-04-07 DIAGNOSIS — K219 Gastro-esophageal reflux disease without esophagitis: Secondary | ICD-10-CM | POA: Diagnosis not present

## 2023-04-07 DIAGNOSIS — M4807 Spinal stenosis, lumbosacral region: Secondary | ICD-10-CM | POA: Diagnosis not present

## 2023-04-07 DIAGNOSIS — J3089 Other allergic rhinitis: Secondary | ICD-10-CM | POA: Diagnosis not present

## 2023-04-07 DIAGNOSIS — M80051A Age-related osteoporosis with current pathological fracture, right femur, initial encounter for fracture: Secondary | ICD-10-CM | POA: Diagnosis not present

## 2023-07-06 DIAGNOSIS — Z0001 Encounter for general adult medical examination with abnormal findings: Secondary | ICD-10-CM | POA: Diagnosis not present

## 2023-07-06 DIAGNOSIS — Z79899 Other long term (current) drug therapy: Secondary | ICD-10-CM | POA: Diagnosis not present

## 2023-07-06 DIAGNOSIS — M4807 Spinal stenosis, lumbosacral region: Secondary | ICD-10-CM | POA: Diagnosis not present

## 2023-07-13 DIAGNOSIS — Z139 Encounter for screening, unspecified: Secondary | ICD-10-CM | POA: Diagnosis not present

## 2023-07-13 DIAGNOSIS — L299 Pruritus, unspecified: Secondary | ICD-10-CM | POA: Diagnosis not present

## 2023-07-13 DIAGNOSIS — J3089 Other allergic rhinitis: Secondary | ICD-10-CM | POA: Diagnosis not present

## 2023-07-13 DIAGNOSIS — L26 Exfoliative dermatitis: Secondary | ICD-10-CM | POA: Diagnosis not present

## 2023-07-13 DIAGNOSIS — E039 Hypothyroidism, unspecified: Secondary | ICD-10-CM | POA: Diagnosis not present

## 2023-07-13 DIAGNOSIS — M109 Gout, unspecified: Secondary | ICD-10-CM | POA: Diagnosis not present

## 2023-08-03 DIAGNOSIS — M109 Gout, unspecified: Secondary | ICD-10-CM | POA: Diagnosis not present

## 2023-08-26 DIAGNOSIS — H2513 Age-related nuclear cataract, bilateral: Secondary | ICD-10-CM | POA: Diagnosis not present

## 2023-08-26 DIAGNOSIS — H02834 Dermatochalasis of left upper eyelid: Secondary | ICD-10-CM | POA: Diagnosis not present

## 2023-08-26 DIAGNOSIS — H02053 Trichiasis without entropian right eye, unspecified eyelid: Secondary | ICD-10-CM | POA: Diagnosis not present

## 2023-08-26 DIAGNOSIS — H02831 Dermatochalasis of right upper eyelid: Secondary | ICD-10-CM | POA: Diagnosis not present

## 2023-08-26 DIAGNOSIS — H02056 Trichiasis without entropian left eye, unspecified eyelid: Secondary | ICD-10-CM | POA: Diagnosis not present

## 2023-08-26 DIAGNOSIS — H179 Unspecified corneal scar and opacity: Secondary | ICD-10-CM | POA: Diagnosis not present

## 2023-08-26 DIAGNOSIS — H11042 Peripheral pterygium, stationary, left eye: Secondary | ICD-10-CM | POA: Diagnosis not present

## 2023-09-21 DIAGNOSIS — R768 Other specified abnormal immunological findings in serum: Secondary | ICD-10-CM | POA: Diagnosis not present

## 2023-09-21 DIAGNOSIS — L299 Pruritus, unspecified: Secondary | ICD-10-CM | POA: Diagnosis not present

## 2023-09-21 DIAGNOSIS — M5440 Lumbago with sciatica, unspecified side: Secondary | ICD-10-CM | POA: Diagnosis not present

## 2023-09-21 DIAGNOSIS — Z6830 Body mass index (BMI) 30.0-30.9, adult: Secondary | ICD-10-CM | POA: Diagnosis not present

## 2023-09-21 DIAGNOSIS — R21 Rash and other nonspecific skin eruption: Secondary | ICD-10-CM | POA: Diagnosis not present

## 2023-09-21 DIAGNOSIS — E669 Obesity, unspecified: Secondary | ICD-10-CM | POA: Diagnosis not present

## 2023-10-19 DIAGNOSIS — M4807 Spinal stenosis, lumbosacral region: Secondary | ICD-10-CM | POA: Diagnosis not present

## 2023-10-19 DIAGNOSIS — R21 Rash and other nonspecific skin eruption: Secondary | ICD-10-CM | POA: Diagnosis not present

## 2023-10-19 DIAGNOSIS — L299 Pruritus, unspecified: Secondary | ICD-10-CM | POA: Diagnosis not present

## 2023-10-19 DIAGNOSIS — J3089 Other allergic rhinitis: Secondary | ICD-10-CM | POA: Diagnosis not present

## 2023-10-19 DIAGNOSIS — M80051A Age-related osteoporosis with current pathological fracture, right femur, initial encounter for fracture: Secondary | ICD-10-CM | POA: Diagnosis not present

## 2023-10-19 DIAGNOSIS — K219 Gastro-esophageal reflux disease without esophagitis: Secondary | ICD-10-CM | POA: Diagnosis not present

## 2023-10-20 ENCOUNTER — Encounter (INDEPENDENT_AMBULATORY_CARE_PROVIDER_SITE_OTHER): Payer: Self-pay | Admitting: Gastroenterology

## 2023-10-21 ENCOUNTER — Other Ambulatory Visit (HOSPITAL_COMMUNITY): Payer: Self-pay | Admitting: Gerontology

## 2023-10-21 DIAGNOSIS — Z13820 Encounter for screening for osteoporosis: Secondary | ICD-10-CM

## 2023-12-10 ENCOUNTER — Encounter: Payer: Self-pay | Admitting: *Deleted

## 2023-12-10 NOTE — Progress Notes (Signed)
 George Alcantar                                          MRN: [id number]   12/10/2023   The [facility] Quality Team Specialist reviewed this patient medical record for the purposes of chart review for care gap closure. The following were reviewed: chart review for care gap closure-breast cancer screening.    [facility] Quality Team

## 2023-12-13 IMAGING — MR MR LUMBAR SPINE W/O CM
4 of 5 series · 18 of 48 positions shown · non-contrast
Comparison: Radiographs April 07, 2021.

CLINICAL DATA: Low back pain, unspecified back pain laterality,
unspecified chronicity, unspecified whether sciatica present
(LYB-NV-CM).

EXAM:
MRI LUMBAR SPINE WITHOUT CONTRAST
TECHNIQUE: Multiplanar, multisequence MR imaging of the lumbar spine was
performed. No intravenous contrast was administered.

[Series 6: T2 · sagittal · 4.0mm · 0.73mm/px · 6 of 15 slices shown (1 of 2)]
[im 1/15]
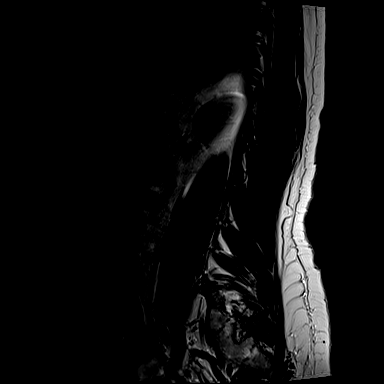
[im 3/15]
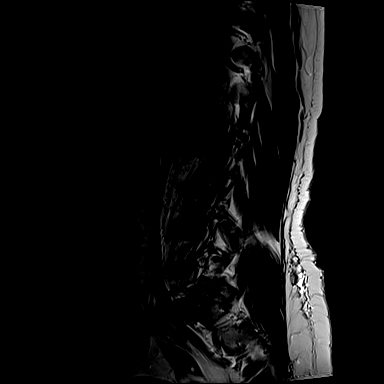
[im 6/15]
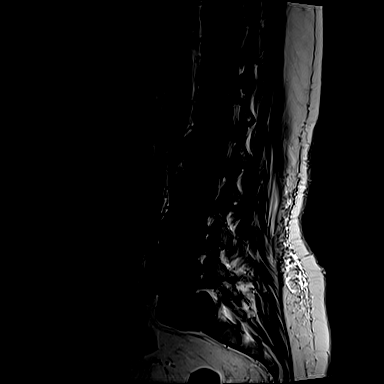
[im 9/15]
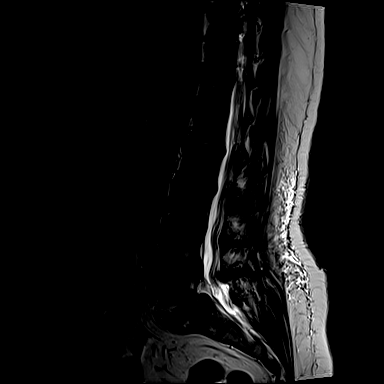
[im 12/15]
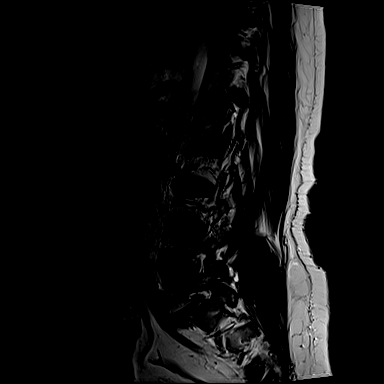
[im 15/15]
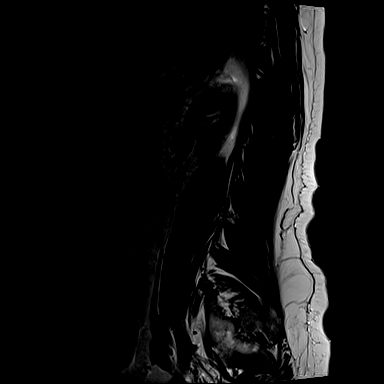

[Series 7: T1 · sagittal · 4.0mm · 0.73mm/px · 3 of 15 slices shown (1 of 2)]
[im 3/15]
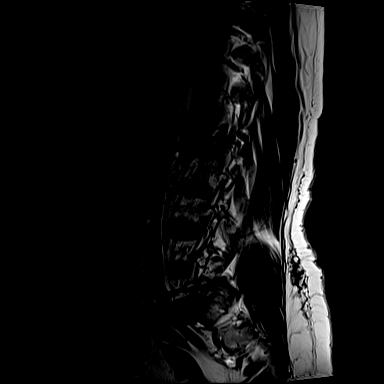
[im 9/15]
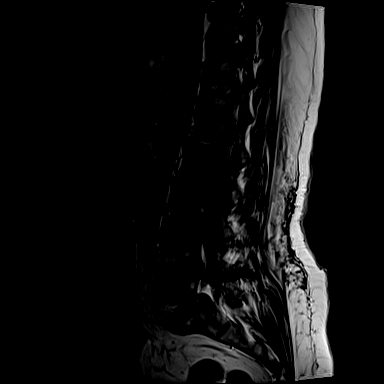
[im 15/15]
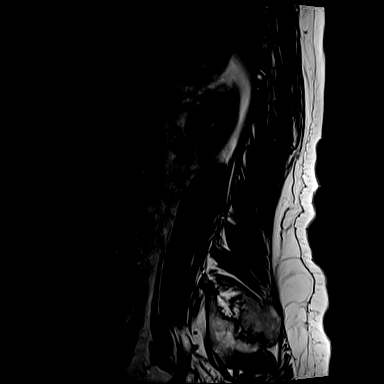

[Series 13: T2 · axial · 4.0mm · 0.28mm/px · z∈[-50,+109]mm · 6 of 35 slices shown (2 of 2)]
[im 1/35]
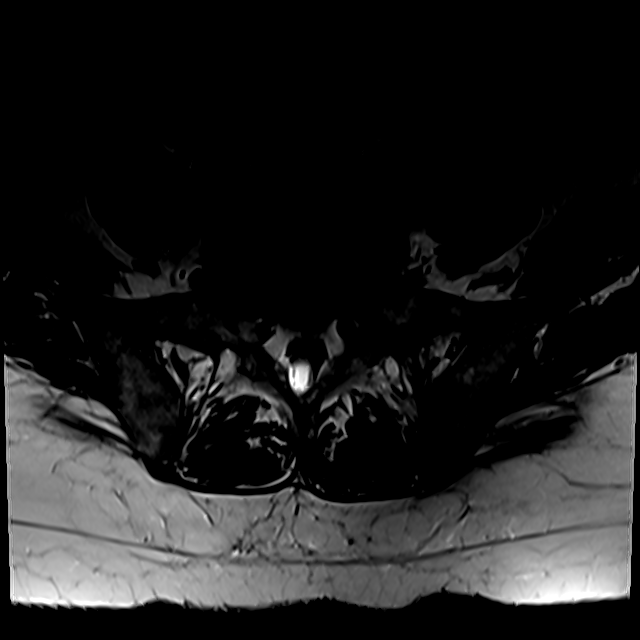
[im 5/35]
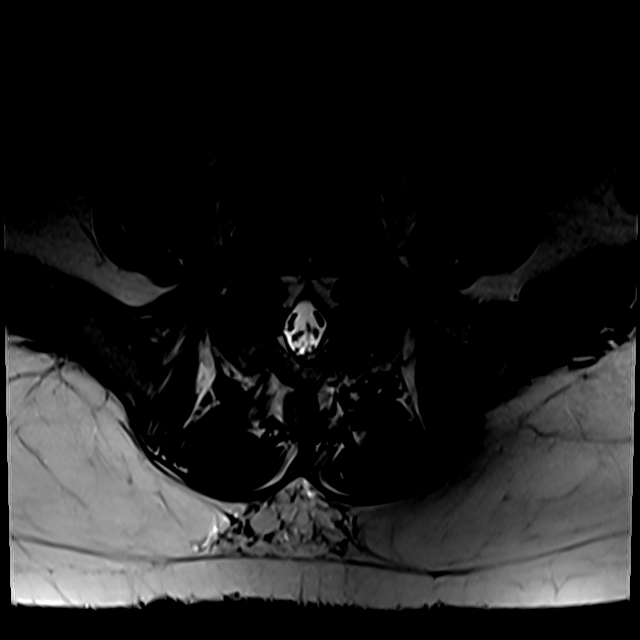
[im 10/35]
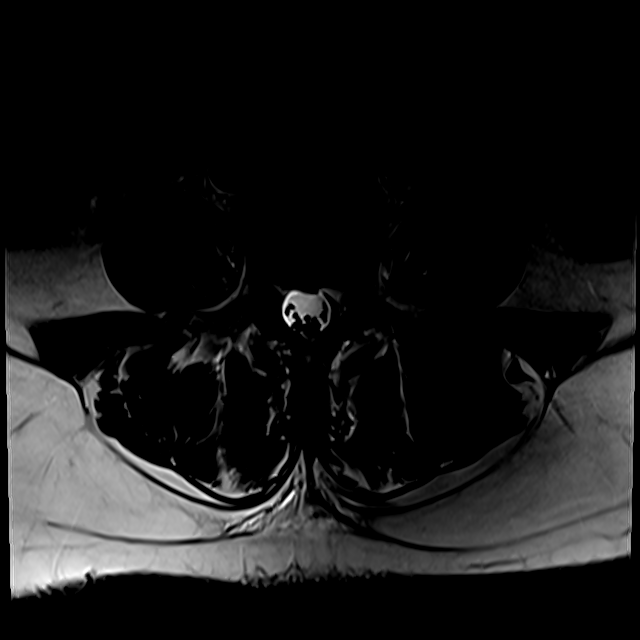
[im 15/35]
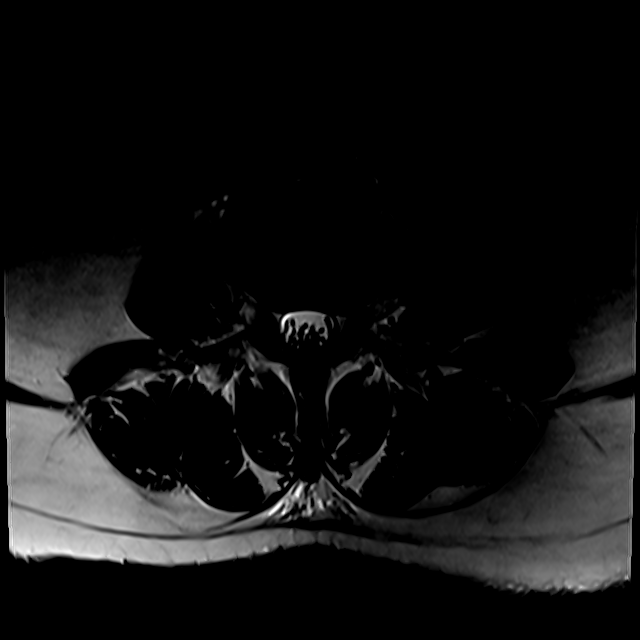
[im 18/35]
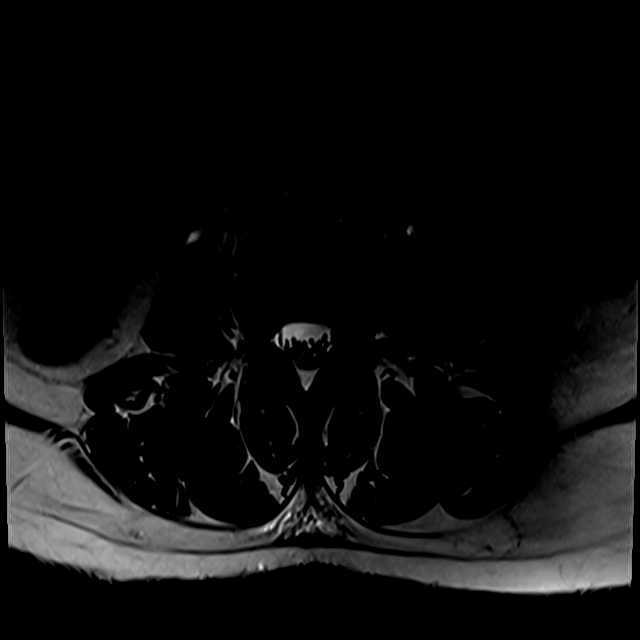
[im 30/35]
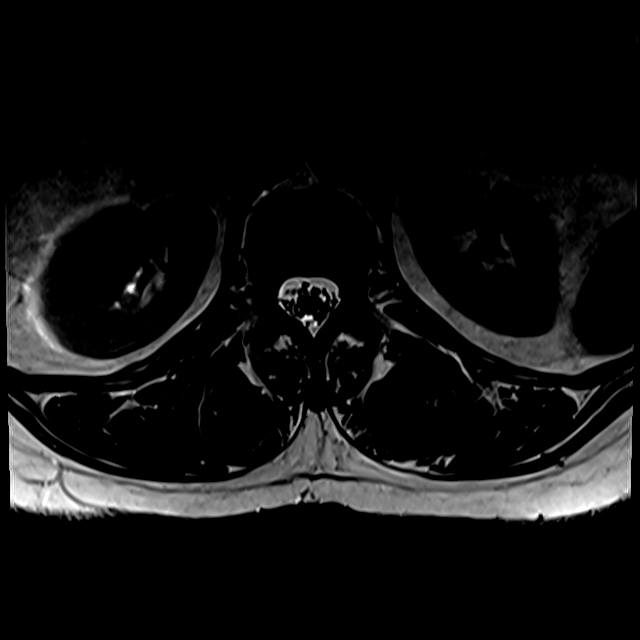

[Series 101: T1 · axial · 4.0mm · 0.28mm/px · z∈[-30,+109]mm · 3 of 35 slices shown (2 of 2)]
[im 5/35]
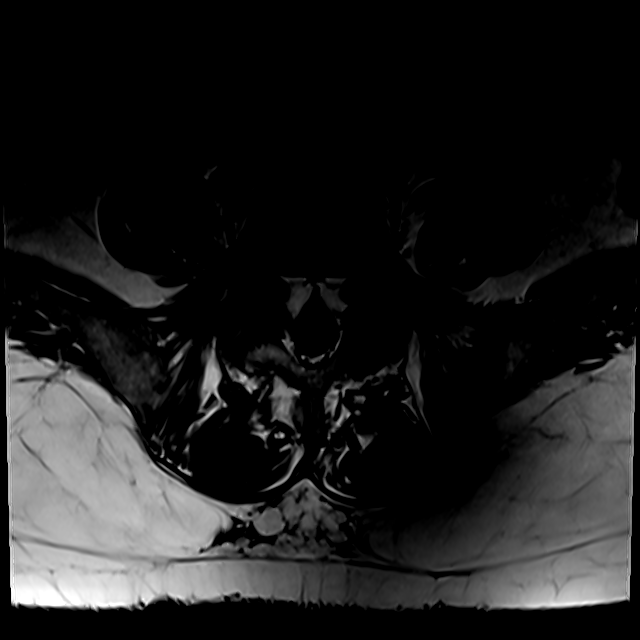
[im 18/35]
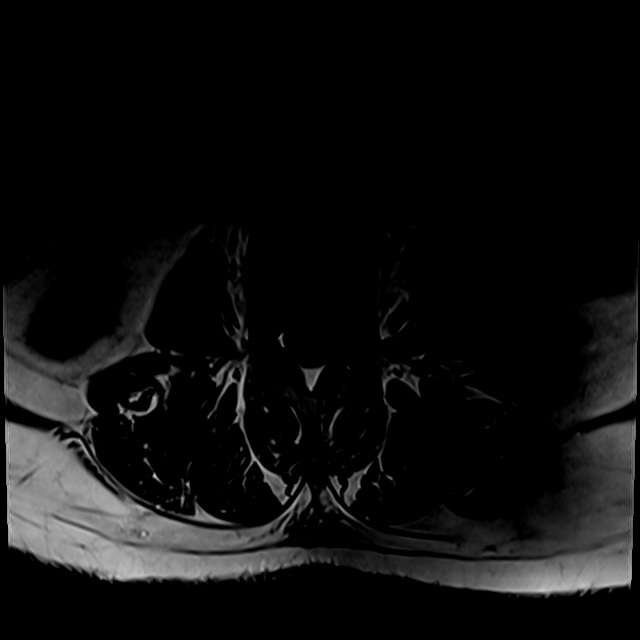
[im 30/35]
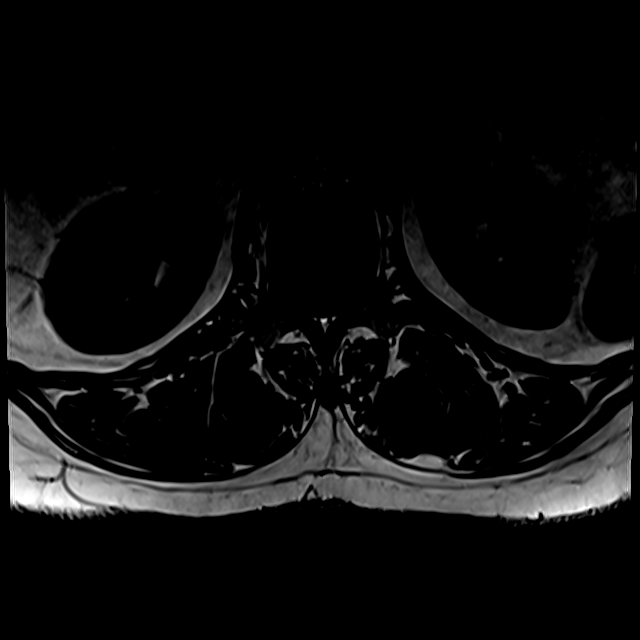

[18 of 48 positions shown; findings below may reference images not displayed]

FINDINGS: Segmentation:  Standard.

Alignment: A 6 mm anterolisthesis of L5 over S1 related to bilateral
pars defect.

Vertebrae:  No fracture, evidence of discitis, or bone lesion.

Conus medullaris and cauda equina: Conus extends to the L1-2 level.
Conus and cauda equina appear normal.

Paraspinal and other soft tissues: Negative.

Disc levels:

On sagittal images, facet degenerative changes with thickening of
the ligamentum flavum is seen at T9-10, T10-11 and T11-12.

T12-L1: No spinal canal or neural foraminal stenosis.

L1-2: Left central disc protrusion and mild facet degenerative
changes without significant spinal canal or neural foraminal
stenosis.

L2-3: Disc bulge, mildly asymmetric to the left and mild facet
degenerative changes resulting mild left neural foraminal narrowing.

L3-4: Right foraminal/far lateral disc protrusion mild facet
degenerative changes resulting in mild right neural foraminal. No
spinal canal stenosis.

L4-5: Disc bulge and mild-to-moderate facet degenerative changes
with ligamentum flavum redundancy resulting in mild-to-moderate
right and moderate left neural foraminal narrowing.

L5-S1: Anterolisthesis, disc bulge/disc uncovering resulting in
severe bilateral neural foraminal narrowing. No significant spinal
canal stenosis.
IMPRESSION: 1. Grade 1 anterolisthesis at L5-S1 with disc bulge/disc uncovering
resulting in severe bilateral neural foraminal narrowing, likely
impinging on the bilateral L5 roots.
2. Mild-to-moderate right and moderate left neural foraminal
narrowing at L4-5.

## 2023-12-24 ENCOUNTER — Other Ambulatory Visit: Payer: Self-pay | Admitting: Internal Medicine

## 2023-12-24 DIAGNOSIS — Z1231 Encounter for screening mammogram for malignant neoplasm of breast: Secondary | ICD-10-CM

## 2023-12-27 ENCOUNTER — Inpatient Hospital Stay: Admission: RE | Admit: 2023-12-27 | Discharge: 2023-12-27 | Attending: Internal Medicine | Admitting: Internal Medicine

## 2023-12-27 DIAGNOSIS — Z1231 Encounter for screening mammogram for malignant neoplasm of breast: Secondary | ICD-10-CM
